# Patient Record
Sex: Female | Born: 1960
Health system: Southern US, Community
[De-identification: ages and names within clinical notes are randomized; demographics above are authoritative.]

## PROBLEM LIST (undated history)

## (undated) DIAGNOSIS — K649 Unspecified hemorrhoids: Secondary | ICD-10-CM

## (undated) DIAGNOSIS — K5790 Diverticulosis of intestine, part unspecified, without perforation or abscess without bleeding: Secondary | ICD-10-CM

## (undated) DIAGNOSIS — L409 Psoriasis, unspecified: Secondary | ICD-10-CM

## (undated) DIAGNOSIS — F329 Major depressive disorder, single episode, unspecified: Secondary | ICD-10-CM

## (undated) DIAGNOSIS — F419 Anxiety disorder, unspecified: Secondary | ICD-10-CM

## (undated) DIAGNOSIS — T7840XA Allergy, unspecified, initial encounter: Secondary | ICD-10-CM

## (undated) DIAGNOSIS — N870 Mild cervical dysplasia: Secondary | ICD-10-CM

## (undated) DIAGNOSIS — M810 Age-related osteoporosis without current pathological fracture: Secondary | ICD-10-CM

## (undated) DIAGNOSIS — Z9889 Other specified postprocedural states: Secondary | ICD-10-CM

## (undated) DIAGNOSIS — K635 Polyp of colon: Secondary | ICD-10-CM

## (undated) DIAGNOSIS — F32A Depression, unspecified: Secondary | ICD-10-CM

## (undated) DIAGNOSIS — R0981 Nasal congestion: Secondary | ICD-10-CM

## (undated) DIAGNOSIS — M1990 Unspecified osteoarthritis, unspecified site: Secondary | ICD-10-CM

## (undated) DIAGNOSIS — D649 Anemia, unspecified: Secondary | ICD-10-CM

## (undated) DIAGNOSIS — J302 Other seasonal allergic rhinitis: Secondary | ICD-10-CM

## (undated) DIAGNOSIS — I700 Atherosclerosis of aorta: Secondary | ICD-10-CM

## (undated) HISTORY — DX: Major depressive disorder, single episode, unspecified: F32.9

## (undated) HISTORY — PX: POLYPECTOMY: SHX149

## (undated) HISTORY — PX: TONSILLECTOMY: SHX5217

## (undated) HISTORY — PX: PELVIC LAPAROSCOPY: SHX162

## (undated) HISTORY — PX: LIPOMA EXCISION: SHX5283

## (undated) HISTORY — DX: Depression, unspecified: F32.A

## (undated) HISTORY — DX: Psoriasis, unspecified: L40.9

## (undated) HISTORY — DX: Mild cervical dysplasia: N87.0

## (undated) HISTORY — DX: Age-related osteoporosis without current pathological fracture: M81.0

## (undated) HISTORY — PX: GYNECOLOGIC CRYOSURGERY: SHX857

## (undated) HISTORY — DX: Nasal congestion: R09.81

## (undated) HISTORY — PX: COLPOSCOPY: SHX161

## (undated) HISTORY — PX: COLON SURGERY: SHX602

## (undated) HISTORY — PX: COLONOSCOPY: SHX174

## (undated) HISTORY — DX: Unspecified hemorrhoids: K64.9

## (undated) HISTORY — DX: Polyp of colon: K63.5

## (undated) HISTORY — DX: Allergy, unspecified, initial encounter: T78.40XA

## (undated) HISTORY — DX: Atherosclerosis of aorta: I70.0

## (undated) HISTORY — DX: Unspecified osteoarthritis, unspecified site: M19.90

---

## 1980-01-07 HISTORY — PX: APPENDECTOMY: SHX54

## 1997-08-10 ENCOUNTER — Ambulatory Visit (HOSPITAL_COMMUNITY): Admission: RE | Admit: 1997-08-10 | Discharge: 1997-08-10 | Payer: Self-pay | Admitting: Obstetrics and Gynecology

## 1998-01-06 HISTORY — PX: ABDOMINAL HYSTERECTOMY: SHX81

## 1998-01-06 HISTORY — PX: OOPHORECTOMY: SHX86

## 1998-07-13 ENCOUNTER — Encounter: Payer: Self-pay | Admitting: Obstetrics and Gynecology

## 1998-07-16 ENCOUNTER — Inpatient Hospital Stay (HOSPITAL_COMMUNITY): Admission: RE | Admit: 1998-07-16 | Discharge: 1998-07-19 | Payer: Self-pay | Admitting: Obstetrics and Gynecology

## 1998-07-16 ENCOUNTER — Encounter (INDEPENDENT_AMBULATORY_CARE_PROVIDER_SITE_OTHER): Payer: Self-pay | Admitting: Specialist

## 1998-09-24 ENCOUNTER — Encounter: Payer: Self-pay | Admitting: Obstetrics and Gynecology

## 1998-09-24 ENCOUNTER — Ambulatory Visit (HOSPITAL_COMMUNITY): Admission: RE | Admit: 1998-09-24 | Discharge: 1998-09-24 | Payer: Self-pay | Admitting: Obstetrics and Gynecology

## 1999-11-06 ENCOUNTER — Encounter: Payer: Self-pay | Admitting: Pulmonary Disease

## 1999-11-06 ENCOUNTER — Ambulatory Visit (HOSPITAL_COMMUNITY): Admission: RE | Admit: 1999-11-06 | Discharge: 1999-11-06 | Payer: Self-pay | Admitting: Pulmonary Disease

## 2000-01-07 HISTORY — PX: OOPHORECTOMY: SHX86

## 2000-07-20 ENCOUNTER — Encounter (INDEPENDENT_AMBULATORY_CARE_PROVIDER_SITE_OTHER): Payer: Self-pay

## 2000-07-21 ENCOUNTER — Inpatient Hospital Stay (HOSPITAL_COMMUNITY): Admission: EM | Admit: 2000-07-21 | Discharge: 2000-07-24 | Payer: Self-pay | Admitting: Obstetrics and Gynecology

## 2001-05-10 ENCOUNTER — Encounter: Payer: Self-pay | Admitting: Obstetrics and Gynecology

## 2001-05-10 ENCOUNTER — Ambulatory Visit (HOSPITAL_COMMUNITY): Admission: RE | Admit: 2001-05-10 | Discharge: 2001-05-10 | Payer: Self-pay | Admitting: Obstetrics and Gynecology

## 2001-06-09 ENCOUNTER — Other Ambulatory Visit: Admission: RE | Admit: 2001-06-09 | Discharge: 2001-06-09 | Payer: Self-pay | Admitting: Obstetrics and Gynecology

## 2002-04-20 ENCOUNTER — Encounter: Payer: Self-pay | Admitting: Family Medicine

## 2002-04-20 ENCOUNTER — Encounter: Admission: RE | Admit: 2002-04-20 | Discharge: 2002-04-20 | Payer: Self-pay | Admitting: Family Medicine

## 2002-11-15 ENCOUNTER — Ambulatory Visit (HOSPITAL_COMMUNITY): Admission: RE | Admit: 2002-11-15 | Discharge: 2002-11-15 | Payer: Self-pay | Admitting: Obstetrics and Gynecology

## 2002-11-24 ENCOUNTER — Other Ambulatory Visit: Admission: RE | Admit: 2002-11-24 | Discharge: 2002-11-24 | Payer: Self-pay | Admitting: Obstetrics and Gynecology

## 2003-11-23 ENCOUNTER — Ambulatory Visit (HOSPITAL_COMMUNITY): Admission: RE | Admit: 2003-11-23 | Discharge: 2003-11-23 | Payer: Self-pay | Admitting: Obstetrics and Gynecology

## 2003-12-05 ENCOUNTER — Other Ambulatory Visit: Admission: RE | Admit: 2003-12-05 | Discharge: 2003-12-05 | Payer: Self-pay | Admitting: Obstetrics and Gynecology

## 2004-12-04 ENCOUNTER — Other Ambulatory Visit: Admission: RE | Admit: 2004-12-04 | Discharge: 2004-12-04 | Payer: Self-pay | Admitting: Obstetrics and Gynecology

## 2005-01-09 ENCOUNTER — Ambulatory Visit (HOSPITAL_COMMUNITY): Admission: RE | Admit: 2005-01-09 | Discharge: 2005-01-09 | Payer: Self-pay | Admitting: Obstetrics and Gynecology

## 2005-09-26 ENCOUNTER — Emergency Department (HOSPITAL_COMMUNITY): Admission: EM | Admit: 2005-09-26 | Discharge: 2005-09-26 | Payer: Self-pay | Admitting: Emergency Medicine

## 2006-02-17 ENCOUNTER — Other Ambulatory Visit: Admission: RE | Admit: 2006-02-17 | Discharge: 2006-02-17 | Payer: Self-pay | Admitting: Obstetrics and Gynecology

## 2006-02-24 ENCOUNTER — Ambulatory Visit (HOSPITAL_COMMUNITY): Admission: RE | Admit: 2006-02-24 | Discharge: 2006-02-24 | Payer: Self-pay | Admitting: Obstetrics and Gynecology

## 2007-01-07 LAB — HM COLONOSCOPY

## 2007-12-21 ENCOUNTER — Ambulatory Visit: Payer: Self-pay | Admitting: Obstetrics and Gynecology

## 2008-05-10 ENCOUNTER — Ambulatory Visit (HOSPITAL_COMMUNITY): Admission: RE | Admit: 2008-05-10 | Discharge: 2008-05-10 | Payer: Self-pay | Admitting: Obstetrics and Gynecology

## 2008-05-17 ENCOUNTER — Ambulatory Visit: Payer: Self-pay | Admitting: Obstetrics and Gynecology

## 2008-05-17 ENCOUNTER — Encounter: Payer: Self-pay | Admitting: Obstetrics and Gynecology

## 2008-05-17 ENCOUNTER — Other Ambulatory Visit: Admission: RE | Admit: 2008-05-17 | Discharge: 2008-05-17 | Payer: Self-pay | Admitting: Obstetrics and Gynecology

## 2008-05-30 ENCOUNTER — Encounter (INDEPENDENT_AMBULATORY_CARE_PROVIDER_SITE_OTHER): Payer: Self-pay | Admitting: *Deleted

## 2009-01-19 ENCOUNTER — Telehealth: Payer: Self-pay | Admitting: Gastroenterology

## 2010-01-27 ENCOUNTER — Encounter: Payer: Self-pay | Admitting: Specialist

## 2010-02-05 NOTE — Progress Notes (Signed)
Summary: Changed practices.  Phone Note Outgoing Call   Call placed by: Harlow Mares CMA Duncan Dull),  January 19, 2009 4:23 PM Call placed to: Patient Summary of Call: patient states that she changed GI MDs and has already had her colonoscopy done. I had Lady Gary put a note in IDX patient transfered care.  Initial call taken by: Harlow Mares CMA Duncan Dull),  January 19, 2009 4:23 PM

## 2010-02-05 NOTE — Letter (Signed)
Summary: Recall Colonoscopy Letter  [facility]  [address] [place], [place] [address]   Phone: [phone]  Fax: [phone]      May 30, 2008 MRN: [id number]   [facility] [address] [place], [place]  [address]   Dear Ms. Sellen,   According to your medical record, it is time for you to schedule a Colonoscopy. The American Cancer Society recommends this procedure as a method to detect early colon cancer. Patients with a family history of colon cancer, or a personal history of colon polyps or inflammatory bowel disease are at increased risk.  This letter has beeen generated based on the recommendations made at the time of your procedure. If you feel that in your particular situation this may no longer apply, please contact our office.  Please call our office at [phone] to schedule this appointment or to update your records at your earliest convenience.  Thank you for cooperating with [place] to provide you with the very best care possible.   Sincerely,  Judie Petit T. Russella Dar, M.D.  [facility] [phone]

## 2010-05-24 NOTE — Discharge Summary (Signed)
[facility]  Patient:    Susan Aguirre, Susan Aguirre              MRN: [id number] Adm. Date:  [id number] Disc. Date: [id number] Attending:  Sharon Mt CC:         Thomas B. Samuella Cota, M.D.   Discharge Summary  HISTORY OF PRESENT ILLNESS:  The patient is a 50 year old female with chronic pelvic pain who was admitted to the hospital for probable right SNO with presumptive diagnosis of recurrent endometriosis.  HOSPITAL COURSE:  On the day of admission, she was taken to the operating room A diagnostic laparoscopy was performed.  Findings were dense small and large bowel adhesions in the pelvis.  She therefore underwent an exploratory laparotomy with lysis of all adhesions.  The findings when the ovary and tube were finally uncovered were endometriosis and she underwent a right salpingo-oophorectomy.  In freeing up the intestine, although there was no bowel injury, blood supply to the mesentery interfered with a very small area. After bleeding had been controlled, it was felt that the bowel looked dusky. Thomas B. Samuella Cota, M.D., was called in, who looked and agreed.  He felt that this portion of small bowel was resected and it was resected by him. Postoperatively, she had a small slight ileus.  By the fourth postoperative day, however, she was passing gas and having bowel movements.  DISCHARGE MEDICATIONS:  She was discharged on Darvocet-N for pain relief.  DIET:  Soft diet to advance as tolerated.  FOLLOW-UP:  She was to return to my office in one week for further follow-up.  FINAL PATHOLOGY REPORT:  Right ovary and tube with fibrous adhesions, decidual stromal change consistent with endometriosis, and small bowel with serosal fibrous  adhesions.  DISCHARGE DIAGNOSES: 1. Chronic right lower quadrant pain. 2. Endometriosis. 3. Ischemic small bowel.  OPERATIONS:  Diagnostic laparoscopy followed by exploratory laparotomy with lysis of small bowel and  large bowel adhesions, right salpingo-oophorectomy, and partial small bowel resection.  CONDITION ON DISCHARGE:  Improved. DD:  08/06/00 TD:  08/07/00 Job: [id number]

## 2010-05-24 NOTE — Op Note (Signed)
[facility]  Patient:    Susan Aguirre, Susan Aguirre              MRN: [id number] Proc. Date: 07/20/00 Adm. Date:  [id number] Attending:  Sharon Mt                           Operative Report  PREOPERATIVE DIAGNOSIS:  Pelvic pain with endometriosis suspected.  POSTOPERATIVE DIAGNOSIS:  Pelvic pain, pelvic adhesive disease, endometriosis, probable.  OPERATION:  Diagnostic laparoscopy followed by exploratory laparotomy with lysis of pelvic adhesions, right salpingo-oophorectomy, and resection of small segment of small-bowel.  SURGEON:  Daniel L. Eda Paschal, M.D. and Donnie Coffin. Samuella Cota, M.D.  FIRST ASSISTANT:  Douglass Rivers, M.D.  FINDINGS:  At the time of both laparoscopy and laparotomy, the patient had dense adhesions of small-bowel and large bowel into the patients pelvis, into the vaginal cuff and into the ovary and tube on right.  You could see why when the patient had ovulatory function there would be pain because of the dense adherence of bowel to the ovary.  In addition there was some stain of the ovary consistent with endometriosis.  PROCEDURE:  After adequate general endotracheal anesthesia, the patient was placed in the dorsolithotomy position and prepped and draped in the usual sterile manner.  A Foley catheter was inserted into the patients bladder.  A pneumoperitoneum was created with Voorhees needle, a 10-mm trocar was placed subumbilical, and through that the operating laparoscope was placed, attached to the camera.  A 5-mm port was placed suprapubically.  Through the laparoscope you could see how densely adherent bowel was, both to the top of the vaginal cuff and also to the right lower quadrant.  In spite of significant manipulations, the ovary and tube could not be seen on the right. It was therefore elected to proceed with laparotomy.  The laparoscope was removed and the subumbilical incision was closed with 0 Vicryl through  the fascia.  A _____ incision was made, th fascia was opened transversely, the peritoneum was entered vertically, subcutaneous bleeders were clamped, and Bovie disencountered.  When the peritoneal cavity was opened, there was dense pelvic adhesive disease involving small and large bowel to the top of the cuff and to the ovary and tube on the right.  It took a significant amount of time to dissect all of these adhesions free.  At this point the retroperitoneal space could be entered, the ureter could be identified, and the infundibular pelvic ligament was clamp, cut, and double suture ligated with #1 chronic catgut. The rest of the attachments of the adnexa to both small and large bowel and peritoneum was freed up without injuring the ureter. There was a small little segment of ovary that was densely adherent to the cecum and this was removed without difficulty.  At this point it was seen that a portion of the small-bowel that had been freed up appeared to be dusky.  There had been some interference of blood flow to the mesentery at this part of the small-bowel, and it was felt that there was some concern how viable this bowel would be long-term.  Dr. Samuella Cota was called in, and he agreed that it did look dusky and felt that she should have that small portion of small-bowel resected, which he did, and it is dictated under his operative note.  At the termination of the procedure, copious irrigation was done with Ringer lactate.  The sponge, needle, and instrument  counts were correct.  The fascia was closed with 2 running 0 Vicryl and the skin was closed with staples. Estimated blood loss for the entire procedure was 200 cc with none replaced. The patient tolerated the procedure well and left the operating room in satisfactory condition draining clear urine from her Foley catheter. DD:  07/20/00 TD:  07/20/00 Job: [id number]

## 2010-05-24 NOTE — H&P (Signed)
[facility]  Patient:    Susan Aguirre, Susan Aguirre                       MRN: [id number] Attending:  Rande Brunt. Eda Paschal, M.D.                         History and Physical  CHIEF COMPLAINT:  Right lower quadrant pain.  HISTORY OF PRESENT ILLNESS:  The patient is a 50 year old, gravida 1, para 0, abortus 1, who presented to the office in September of last year with a history of cyclical right lower quadrant pain.  It will last approximately two to three days.  It feels very similar to when she had the endometriosis prior to her previous surgery and it is making her incapacitated with it.  She had previously undergone a total abdominal hysterectomy and left salpingo-oophorectomy for endometriosis, done back in the year 2000.  She was initially treated with Depo-Provera for the above and found that it did suppress her discomfort.  She has done that for awhile, but now she has started to have side-effects from the Depo-Provera, including hair loss, depression, increasing facial hair, and headaches.  She had previously failed oral contraceptives and had not done well prior to her hysterectomy with Depo-Lupron.  As a result of the above and the fact that it is cyclical, it is suspected that she either has recurrent endometriosis or pelvic adhesive disease.  As a result of the above, she now enters the hospital for diagnostic laparoscopy with right salpingo-oophorectomy.  She does understand the issues of starting menopause at 31.  She also understands that her surgery may not be doable laparoscopically, in which case she has given me permission to proceed with an incision.  CURRENT MEDICATIONS:  Just medicine for pain.  ALLERGIES:  CODEINE.  PREVIOUS SURGERIES:  Appendectomy in 1980, total abdominal hysterectomy and left salpingo-oophorectomy in July 2000.  FAMILY HISTORY:  Grandmother and mother are diabetic; grandmother, mother, and uncle have hypertension;  grandmother, mother, and uncle have cardiovascular disease; father and aunt have had lymphoma.  REVIEW OF SYSTEMS:  HEENT: Negative, except for headaches and neck stiffness. CARDIOVASCULAR: Negative.  RESPIRATORY: Negative.  GI: Reveals a history of hemorrhoids and right lower quadrant pain.  NEUROMUSCULAR: Negative. GU: Negative.  ALLERGIC/IMMUNE/LYMPHATIC/ENDOCRINE: Negative.  PHYSICAL EXAMINATION:  GENERAL:  The patient is a well-developed and well-nourished female in no acute distress.  VITAL SIGNS:  Blood pressure 130/70, pulse 80 and regular, respirations 16 and nonlabored.  She is afebrile.  HEENT:  All within normal limits.  NECK:  Supple, trachea midline.  Thyroid is not enlarged.  LUNGS:  Clear to P&A.  HEART:  No thrills, heaves, or murmurs.  BREASTS:  No masses.  ABDOMEN:  Soft, without guarding, rebound, or masses.  PELVIC:  External and vaginal within normal limits.  Pap smear shows no atypia.  Bimanual and rectal fail to reveal any masses nor were there any masses on ultrasound, however, her right adnexa is exquisitely tender.  ADMISSION IMPRESSION:  Cyclical right lower quadrant pain with recurrent endometriosis suspected.  PLAN:  DL with right S&O, possible laparotomy. DD:  07/19/00 TD:  07/19/00 Job: [id number]

## 2010-05-24 NOTE — Op Note (Signed)
[facility]  Patient:    Susan Aguirre, Susan Aguirre              MRN: [id number] Adm. Date:  [id number] Attending:  Sharon Mt                           Operative Report  PREOPERATIVE DIAGNOSIS:  Ischemic small-bowel.  POSTOPERATIVE DIAGNOSIS:  Ischemic small-bowel.  OPERATION:  Partial small-bowel resection.  SURGEON:  Maisie Fus B. Samuella Cota, M.D.  ASSISTANT:  Rande Brunt. Eda Paschal, M.D.  PROCEDURE:  Dr. Edyth Gunnels was doing an open laparotomy, and in the process of taking down some small-bowel that was adherent to the right tube and ovaries, the mesentery of the small-bowel developed bleeding, and after the bleeders were ligated there seemed to be a small segment of distal small-bowel which was somewhat ischemic.  I was asked to see the patient for evaluation.  The evaluation was that the bowel did look ischemic, and it was felt safer to remove a short segment of this small-bowel with an anastomosis. The small-bowel was divided between Kocher clamps proximal and distal to this ischemic area.  The crushed ends of the small-bowel were then excised and an end-to-end anastomosis was carried out using 3-0 black silk.  The sutures were placed through-and-through with the knots on the inside except for the last few sutures anteriorly which were placed as a Gambee sutures.  This gave good closure of the defect with minimal exposed mucosa.  The anastomosis was airtight to compression on each side.  The small-bowel mesentery was closed with several interrupted sutures of 3-0 black silk.  Dr. Eda Paschal will dictate his portion of the operation and dictate the closure. DD:  07/20/00 TD:  07/20/00 Job: [id number]

## 2010-10-04 ENCOUNTER — Other Ambulatory Visit: Payer: Self-pay | Admitting: Obstetrics and Gynecology

## 2010-10-04 DIAGNOSIS — Z1231 Encounter for screening mammogram for malignant neoplasm of breast: Secondary | ICD-10-CM

## 2010-10-22 ENCOUNTER — Ambulatory Visit (HOSPITAL_COMMUNITY)
Admission: RE | Admit: 2010-10-22 | Discharge: 2010-10-22 | Disposition: A | Discharge status: Home / Self Care | Payer: Managed Care, Other (non HMO) | Source: Ambulatory Visit | Attending: Obstetrics and Gynecology | Admitting: Obstetrics and Gynecology

## 2010-10-22 DIAGNOSIS — Z1231 Encounter for screening mammogram for malignant neoplasm of breast: Secondary | ICD-10-CM | POA: Insufficient documentation

## 2010-11-21 LAB — HM PAP SMEAR: HM Pap smear: NORMAL

## 2010-11-21 LAB — HM MAMMOGRAPHY: HM Mammogram: NEGATIVE

## 2010-11-26 DIAGNOSIS — N809 Endometriosis, unspecified: Secondary | ICD-10-CM | POA: Insufficient documentation

## 2010-12-04 ENCOUNTER — Encounter: Payer: Self-pay | Admitting: Obstetrics and Gynecology

## 2010-12-04 ENCOUNTER — Ambulatory Visit (INDEPENDENT_AMBULATORY_CARE_PROVIDER_SITE_OTHER): Payer: Managed Care, Other (non HMO) | Admitting: Obstetrics and Gynecology

## 2010-12-04 ENCOUNTER — Other Ambulatory Visit (HOSPITAL_COMMUNITY)
Admission: RE | Admit: 2010-12-04 | Discharge: 2010-12-04 | Disposition: A | Discharge status: Home / Self Care | Payer: Managed Care, Other (non HMO) | Source: Ambulatory Visit | Attending: Obstetrics and Gynecology | Admitting: Obstetrics and Gynecology

## 2010-12-04 VITALS — BP 120/76 | Ht 66.0 in | Wt 110.0 lb

## 2010-12-04 DIAGNOSIS — R823 Hemoglobinuria: Secondary | ICD-10-CM

## 2010-12-04 DIAGNOSIS — N952 Postmenopausal atrophic vaginitis: Secondary | ICD-10-CM

## 2010-12-04 DIAGNOSIS — N920 Excessive and frequent menstruation with regular cycle: Secondary | ICD-10-CM

## 2010-12-04 DIAGNOSIS — Z01419 Encounter for gynecological examination (general) (routine) without abnormal findings: Secondary | ICD-10-CM

## 2010-12-04 DIAGNOSIS — N870 Mild cervical dysplasia: Secondary | ICD-10-CM | POA: Insufficient documentation

## 2010-12-04 DIAGNOSIS — M810 Age-related osteoporosis without current pathological fracture: Secondary | ICD-10-CM

## 2010-12-04 DIAGNOSIS — K649 Unspecified hemorrhoids: Secondary | ICD-10-CM | POA: Insufficient documentation

## 2010-12-04 NOTE — Progress Notes (Signed)
Addended by: Landis Martins R on: 12/04/2010 05:24 PM   Modules accepted: Orders

## 2010-12-04 NOTE — Progress Notes (Signed)
Patient came to see me today for her annual GYN exam. She continues to have severe hot flashes but is scared to take HRT. She has vaginal dryness but says is responding well to over-the-counter medication. She is still mammograms. She is osteoporosis on bone density and we discussed this previously and she continues to refuse to take medication. She's had no vaginal bleeding. She is having no pelvic pain. She had a see a surgeon about her hemorrhoids and he was able to help her nonsurgically. She does her lab through PCP.  HEENT: Within normal limits.  Kennon Portela present Neck: No masses. Supraclavicular lymph nodes: Not enlarged. Breasts: Examined in both sitting and lying position. Symmetrical without skin changes or masses. Abdomen: Soft no masses guarding or rebound. No hernias. Pelvic: External within normal limits. BUS within normal limits. Vaginal examination shows good estrogen effect, no cystocele enterocele or rectocele. Cervix and uterus absent. Adnexa within normal limits. Rectovaginal confirmatory. Extremities within normal limits.  Assessment: #1. Menopausal symptoms #2. Osteoporosis  Plan: Explained to her that taking estrogen without progesterone does not increase her risk of breast cancer. Discussed estrogen patch to prevent a higher risk of DVT. She still declined. She still declined treatment for her osteoporosis. She will continue yearly mammograms. We discussed frequency of Pap smears. 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 It is now been over 20 years. I told her I thought we could do a Pap smear every 2-3 years.

## 2010-12-09 MED ORDER — FLUCONAZOLE 150 MG PO TABS: 150.0000 mg | ORAL_TABLET | Freq: Every day | ORAL | Status: DC

## 2010-12-09 NOTE — Progress Notes (Signed)
Addended by: Venora Maples on: 12/09/2010 04:16 PM   Modules accepted: Orders

## 2010-12-10 ENCOUNTER — Encounter: Payer: Self-pay | Admitting: Gynecology

## 2011-01-03 ENCOUNTER — Emergency Department (INDEPENDENT_AMBULATORY_CARE_PROVIDER_SITE_OTHER)
Admission: EM | Admit: 2011-01-03 | Discharge: 2011-01-03 | Disposition: A | Discharge status: Home / Self Care | Payer: Managed Care, Other (non HMO) | Source: Home / Self Care

## 2011-01-03 ENCOUNTER — Encounter (HOSPITAL_COMMUNITY): Payer: Self-pay

## 2011-01-03 DIAGNOSIS — J988 Other specified respiratory disorders: Secondary | ICD-10-CM

## 2011-01-03 DIAGNOSIS — B9789 Other viral agents as the cause of diseases classified elsewhere: Secondary | ICD-10-CM

## 2011-01-03 DIAGNOSIS — J9801 Acute bronchospasm: Secondary | ICD-10-CM

## 2011-01-03 HISTORY — DX: Anxiety disorder, unspecified: F41.9

## 2011-01-03 HISTORY — DX: Other seasonal allergic rhinitis: J30.2

## 2011-01-03 MED ORDER — ALBUTEROL SULFATE (5 MG/ML) 0.5% IN NEBU
INHALATION_SOLUTION | RESPIRATORY_TRACT | Status: AC
  Filled 2011-01-03: qty 0.5

## 2011-01-03 MED ORDER — IPRATROPIUM BROMIDE 0.02 % IN SOLN
0.5000 mg | Freq: Once | RESPIRATORY_TRACT | Status: AC
  Administered 2011-01-03: 0.5 mg via RESPIRATORY_TRACT

## 2011-01-03 MED ORDER — ALBUTEROL SULFATE (5 MG/ML) 0.5% IN NEBU
2.5000 mg | INHALATION_SOLUTION | Freq: Once | RESPIRATORY_TRACT | Status: AC
  Administered 2011-01-03: 2.5 mg via RESPIRATORY_TRACT

## 2011-01-03 MED ORDER — ALBUTEROL SULFATE HFA 108 (90 BASE) MCG/ACT IN AERS: 2.0000 | INHALATION_SPRAY | RESPIRATORY_TRACT | Status: DC | PRN

## 2011-01-03 MED ORDER — ALBUTEROL SULFATE (5 MG/ML) 0.5% IN NEBU: 5.0000 mg | INHALATION_SOLUTION | Freq: Once | RESPIRATORY_TRACT | Status: DC

## 2011-01-03 NOTE — ED Provider Notes (Signed)
History     CSN: [id number]  Arrival date & time 01/03/11  1416   None     Chief Complaint  Patient presents with  . Cough  . Generalized Body Aches    (Consider location/radiation/quality/duration/timing/severity/associated sxs/prior treatment) HPI Comments: Onset 4 days ago of cough. Pt describes it as a "tickley cough" in her throat. She has had some wheezing, but denies dyspnea. She also c/o body aches, weakness, chills, small amount of rhinorrhea, nausea, and sore throat. No fever. She has been taking Mucinex for her symptoms. No hx of asthma or COPD.  The history is provided by the patient.    Past Medical History  Diagnosis Date  . Endometriosis   . Psoriasis   . Calcification of aorta     Abdominal aorta  . CIN I (cervical intraepithelial neoplasia I)   . Hemorrhoid   . Dysplastic colon polyp   . Anxiety   . Seasonal allergies     Past Surgical History  Procedure Date  . Oophorectomy 2002    DX LAP W/RSO  . Oophorectomy 2000    TAH W LSO  . Abdominal hysterectomy 2000    TAH,LSO (RSO IN 2002)  . Appendectomy 1982    LYSIS OF BOWEL ADHESIONS  . Colposcopy   . Gynecologic cryosurgery   . Pelvic laparoscopy     DL    Family History  Problem Relation Age of Onset  . Diabetes Mother   . Hypertension Mother   . Lymphoma Father   . Diabetes Maternal Grandmother   . Hypertension Maternal Grandmother   . Heart disease Maternal Grandmother     History  Substance Use Topics  . Smoking status: Current Everyday Smoker -- 0.5 packs/day    Types: Cigarettes  . Smokeless tobacco: Not on file  . Alcohol Use: 1.0 oz/week    2 drink(s) per week    OB History    Grav Para Term Preterm Abortions TAB SAB Ect Mult Living   1    1     0      Review of Systems  Constitutional: Positive for chills. Negative for fever.  HENT: Positive for sore throat and rhinorrhea. Negative for ear pain, congestion and sinus pressure.   Respiratory: Positive for cough and  wheezing. Negative for shortness of breath.   Cardiovascular: Negative for chest pain.    Allergies  Codeine  Home Medications   Current Outpatient Rx  Name Route Sig Dispense Refill  . ALPRAZOLAM 0.5 MG PO TABS Oral Take 0.5 mg by mouth at bedtime as needed.      . ASPIRIN PO Oral Take 81 mg by mouth.     Marland Kitchen CALCIUM + D PO Oral Take by mouth.      . ALBUTEROL SULFATE HFA 108 (90 BASE) MCG/ACT IN AERS Inhalation Inhale 2 puffs into the lungs every 4 (four) hours as needed for wheezing. 1 Inhaler 0    BP 143/100  Pulse 79  Temp(Src) 98.6 F (37 C) (Oral)  Resp 20  SpO2 99%  Physical Exam  Nursing note and vitals reviewed. Constitutional: She appears well-developed and well-nourished. No distress.  HENT:  Head: Normocephalic and atraumatic.  Right Ear: Tympanic membrane, external ear and ear canal normal.  Left Ear: Tympanic membrane, external ear and ear canal normal.  Nose: Nose normal.  Mouth/Throat: Uvula is midline, oropharynx is clear and moist and mucous membranes are normal. No oropharyngeal exudate, posterior oropharyngeal edema or posterior oropharyngeal erythema.  Neck:  Neck supple.  Cardiovascular: Normal rate, regular rhythm and normal heart sounds.   Pulmonary/Chest: Effort normal. No respiratory distress. She has decreased breath sounds (bilat). She has wheezes (bilat). She has no rhonchi. She has no rales.  Lymphadenopathy:    She has no cervical adenopathy.  Neurological: She is alert.  Skin: Skin is warm and dry.  Psychiatric: She has a normal mood and affect.    ED Course  Procedures (including critical care time)  Labs Reviewed - No data to display No results found.   1. Viral respiratory infection   2. Bronchospasm       MDM  After NMT Lungs CTA.         Melody Comas, [place] 01/03/11 [phone]

## 2011-01-03 NOTE — ED Notes (Signed)
C/o dry cough, body aches, ear and head pressure, burning in throat and chest and nausea for 5 days.

## 2011-01-07 NOTE — ED Provider Notes (Signed)
Medical screening examination/treatment/procedure(s) were performed by non-physician practitioner and as supervising physician I was immediately available for consultation/collaboration.   Bolton Canupp DOUGLAS MD.    Callen Vancuren Douglas Mackey Varricchio, MD 01/07/11 1400 

## 2011-02-21 ENCOUNTER — Ambulatory Visit (INDEPENDENT_AMBULATORY_CARE_PROVIDER_SITE_OTHER): Payer: Managed Care, Other (non HMO) | Admitting: Internal Medicine

## 2011-02-21 ENCOUNTER — Encounter: Payer: Self-pay | Admitting: Internal Medicine

## 2011-02-21 DIAGNOSIS — Z0000 Encounter for general adult medical examination without abnormal findings: Secondary | ICD-10-CM

## 2011-02-21 DIAGNOSIS — K635 Polyp of colon: Secondary | ICD-10-CM | POA: Insufficient documentation

## 2011-02-21 DIAGNOSIS — D126 Benign neoplasm of colon, unspecified: Secondary | ICD-10-CM

## 2011-02-21 LAB — COMPREHENSIVE METABOLIC PANEL
ALT: 11 U/L (ref 0–35)
AST: 23 U/L (ref 0–37)
Albumin: 4.2 g/dL (ref 3.5–5.2)
Alkaline Phosphatase: 68 U/L (ref 39–117)
BUN: 12 mg/dL (ref 6–23)
CO2: 28 mEq/L (ref 19–32)
Calcium: 9.5 mg/dL (ref 8.4–10.5)
Chloride: 106 mEq/L (ref 96–112)
Creatinine, Ser: 0.6 mg/dL (ref 0.4–1.2)
GFR: 103.3 mL/min (ref >60.00)
Glucose, Bld: 83 mg/dL (ref 70–99)
Potassium: 5.1 mEq/L (ref 3.5–5.1)
Sodium: 140 mEq/L (ref 135–145)
Total Bilirubin: 0.7 mg/dL (ref 0.3–1.2)
Total Protein: 6.9 g/dL (ref 6.0–8.3)

## 2011-02-21 LAB — LIPID PANEL
Cholesterol: 183 mg/dL (ref 0–200)
HDL: 65.1 mg/dL (ref >39.00)
LDL Cholesterol: 94 mg/dL (ref 0–99)
Total CHOL/HDL Ratio: 3
Triglycerides: 120 mg/dL (ref 0.0–149.0)
VLDL: 24 mg/dL (ref 0.0–40.0)

## 2011-02-21 LAB — CBC WITH DIFFERENTIAL/PLATELET
Basophils Absolute: 0 10*3/uL (ref 0.0–0.1)
Basophils Relative: 0.1 % (ref 0.0–3.0)
Eosinophils Absolute: 0.1 10*3/uL (ref 0.0–0.7)
Eosinophils Relative: 1.3 % (ref 0.0–5.0)
HCT: 39.2 % (ref 36.0–46.0)
Hemoglobin: 13.1 g/dL (ref 12.0–15.0)
Lymphocytes Relative: 36.2 % (ref 12.0–46.0)
Lymphs Abs: 3.9 10*3/uL (ref 0.7–4.0)
MCHC: 33.5 g/dL (ref 30.0–36.0)
MCV: 92.3 fl (ref 78.0–100.0)
Monocytes Absolute: 0.6 10*3/uL (ref 0.1–1.0)
Monocytes Relative: 6 % (ref 3.0–12.0)
Neutro Abs: 6.1 10*3/uL (ref 1.4–7.7)
Neutrophils Relative %: 56.4 % (ref 43.0–77.0)
Platelets: 244 10*3/uL (ref 150.0–400.0)
RBC: 4.25 Mil/uL (ref 3.87–5.11)
RDW: 13.6 % (ref 11.5–14.6)
WBC: 10.8 10*3/uL — ABNORMAL HIGH (ref 4.5–10.5)

## 2011-02-21 LAB — TSH: TSH: 1.21 u[IU]/mL (ref 0.35–5.50)

## 2011-02-21 MED ORDER — ALPRAZOLAM 0.5 MG PO TABS: 0.5000 mg | ORAL_TABLET | Freq: Three times a day (TID) | ORAL | Status: DC | PRN

## 2011-02-21 MED ORDER — ALBUTEROL SULFATE HFA 108 (90 BASE) MCG/ACT IN AERS: 2.0000 | INHALATION_SPRAY | RESPIRATORY_TRACT | Status: DC | PRN

## 2011-02-21 NOTE — Progress Notes (Signed)
Quick Note:  Spoke with pt - informed of labs ______ 

## 2011-02-21 NOTE — Patient Instructions (Signed)
Smoking tobacco is very bad for your health. You should stop smoking immediately.  Take a calcium supplement, plus [phone] units of vitamin D    It is important that you exercise regularly, at least 20 minutes 3 to 4 times per week.  If you develop chest pain or shortness of breath seek  medical attention.  Return in one year for follow-up

## 2011-02-21 NOTE — Progress Notes (Signed)
Subjective:    Patient ID: Susan Aguirre, female    DOB: May 10, 1960, 51 y.o.   MRN: [id number]  HPI  51 y/o is seen today to establish  with our practice.  Medical problems include a history of allergic rhinitis mild asthma. She has seen allergy medicine in the past. She has a history of colonic polyps and is scheduled for followup colonoscopy in one year. She has remote history of endometriosis and is status post gynecologic surgery in 2000 and 2002. She has had a complete hysterectomy and oophorectomy. She has a history of mild anxiety and depression and does use alprazolam 2 or 3 times daily. She is followed annually by gynecology.  Past Medical History  Diagnosis Date  . Endometriosis   . Psoriasis   . Calcification of aorta     Abdominal aorta  . CIN I (cervical intraepithelial neoplasia I)   . Hemorrhoid   . Dysplastic colon polyp   . Anxiety   . Seasonal allergies   . Depression   . Asthma   . Arthritis     History   Social History  . Marital Status: Married    Spouse Name: N/A    Number of Children: N/A  . Years of Education: N/A   Occupational History  . Not on file.   Social History Main Topics  . Smoking status: Current Everyday Smoker -- 0.5 packs/day    Types: Cigarettes  . Smokeless tobacco: Never Used  . Alcohol Use: 1.0 oz/week    2 drink(s) per week  . Drug Use: No  . Sexually Active: Yes    Birth Control/ Protection: Surgical   Other Topics Concern  . Not on file   Social History Narrative  . No narrative on file    Past Surgical History  Procedure Date  . Oophorectomy 2002    DX LAP W/RSO  . Oophorectomy 2000    TAH W LSO  . Abdominal hysterectomy 2000    TAH,LSO (RSO IN 2002)  . Appendectomy 1982    LYSIS OF BOWEL ADHESIONS  . Colposcopy   . Gynecologic cryosurgery   . Pelvic laparoscopy     DL  . Tonsillectomy     Family History  Problem Relation Age of Onset  . Diabetes Mother   . Hypertension Mother   . Lymphoma  Father   . Cancer Father   . Diabetes Maternal Grandmother   . Hypertension Maternal Grandmother   . Heart disease Maternal Grandmother     Allergies  Allergen Reactions  . Codeine     Current Outpatient Prescriptions on File Prior to Visit  Medication Sig Dispense Refill  . albuterol (PROVENTIL HFA;VENTOLIN HFA) 108 (90 BASE) MCG/ACT inhaler Inhale 2 puffs into the lungs every 4 (four) hours as needed for wheezing.  1 Inhaler  0  . ALPRAZolam (XANAX) 0.5 MG tablet Take 0.5 mg by mouth 3 (three) times daily as needed.       . ASPIRIN PO Take 81 mg by mouth.       . Calcium Carbonate-Vitamin D (CALCIUM + D PO) Take by mouth.          BP 120/84  Pulse 72  Temp(Src) 98.1 F (36.7 C) (Oral)  Resp 18  Ht 5' 6.75" (1.695 m)  Wt 112 lb (50.803 kg)  BMI 17.67 kg/m2  SpO2 98%       Review of Systems  Constitutional: Negative for fever, appetite change, fatigue and unexpected weight change.  HENT:  Negative for hearing loss, ear pain, nosebleeds, congestion, sore throat, mouth sores, trouble swallowing, neck stiffness, dental problem, voice change, sinus pressure and tinnitus.   Eyes: Negative for photophobia, pain, redness and visual disturbance.  Respiratory: Negative for cough, chest tightness and shortness of breath.   Cardiovascular: Negative for chest pain, palpitations and leg swelling.  Gastrointestinal: Negative for nausea, vomiting, abdominal pain, diarrhea, constipation, blood in stool, abdominal distention and rectal pain.  Genitourinary: Negative for dysuria, urgency, frequency, hematuria, flank pain, vaginal bleeding, vaginal discharge, difficulty urinating, genital sores, vaginal pain, menstrual problem and pelvic pain.  Musculoskeletal: Negative for back pain and arthralgias.  Skin: Negative for rash.  Neurological: Negative for dizziness, syncope, speech difficulty, weakness, light-headedness, numbness and headaches.  Hematological: Negative for adenopathy. Does  not bruise/bleed easily.  Psychiatric/Behavioral: Negative for suicidal ideas, behavioral problems, self-injury, dysphoric mood and agitation. The patient is not nervous/anxious.        Objective:   Physical Exam  Constitutional: She is oriented to person, place, and time. She appears well-developed and well-nourished.  HENT:  Head: Normocephalic and atraumatic.  Right Ear: External ear normal.  Left Ear: External ear normal.  Mouth/Throat: Oropharynx is clear and moist.  Eyes: Conjunctivae and EOM are normal.  Neck: Normal range of motion. Neck supple. No JVD present. No thyromegaly present.  Cardiovascular: Normal rate, regular rhythm, normal heart sounds and intact distal pulses.   No murmur heard. Pulmonary/Chest: Effort normal and breath sounds normal. She has no wheezes. She has no rales.  Abdominal: Soft. Bowel sounds are normal. She exhibits no distension and no mass. There is no tenderness. There is no rebound and no guarding.  Genitourinary: Vagina normal.  Musculoskeletal: Normal range of motion. She exhibits no edema and no tenderness.  Neurological: She is alert and oriented to person, place, and time. She has normal reflexes. No cranial nerve deficit. She exhibits normal muscle tone. Coordination normal.  Skin: Skin is warm and dry. No rash noted.  Psychiatric: She has a normal mood and affect. Her behavior is normal.          Assessment & Plan:  Preventive health examination  Ongoing tobacco use History of allergic rhinitis and asthmatic bronchitis History of colonic polyps  Recheck one year Laboratory data reviewed Total cessation of smoking encouraged

## 2011-04-03 ENCOUNTER — Telehealth: Payer: Self-pay | Admitting: Family Medicine

## 2011-04-03 NOTE — Telephone Encounter (Signed)
Pt requesting you re-fax lab results.  Pt also has questions about her husband b12 shot. Please contact

## 2011-04-03 NOTE — Telephone Encounter (Signed)
Spoke with pt- discussed labs - and B12 injection for husband

## 2011-04-18 ENCOUNTER — Telehealth: Payer: Self-pay | Admitting: *Deleted

## 2011-04-18 NOTE — Telephone Encounter (Signed)
Pt would like to have Susan Aguirre get all the copies of her labs that were sent to [facility] so she can pick them up.  [facility] keeps saying they do not have the copies Kim sent.

## 2011-04-21 NOTE — Telephone Encounter (Signed)
ptp aware ready for pick up

## 2011-04-30 ENCOUNTER — Telehealth: Payer: Self-pay | Admitting: Internal Medicine

## 2011-04-30 NOTE — Telephone Encounter (Signed)
Pt called and said that the previous fax # on Wellness Form is wrong. The correct fax # is [phone].

## 2011-05-02 NOTE — Telephone Encounter (Signed)
Sent ppwk to new fax # given - confirmation recv'd.

## 2011-07-11 ENCOUNTER — Telehealth: Payer: Self-pay | Admitting: Gastroenterology

## 2011-07-11 NOTE — Telephone Encounter (Signed)
Dr. Stark do you approve of transfer? 

## 2011-07-13 NOTE — Telephone Encounter (Signed)
OK with me.

## 2011-07-14 NOTE — Telephone Encounter (Signed)
Dr. Marina Goodell will you accept this patient? Patient would like to switch to you because you see her husband.  Please advise

## 2011-07-14 NOTE — Telephone Encounter (Signed)
Sure. Thanks 

## 2011-07-15 ENCOUNTER — Encounter: Payer: Self-pay | Admitting: Internal Medicine

## 2011-07-15 NOTE — Telephone Encounter (Signed)
Called pt and sch'd a direct colon w/Dr. Marina Goodell for 08-27-11 and previsit on 08-14-11 @ 4:30. Mailed pt paperwork and notified of cancelation policy

## 2011-08-14 ENCOUNTER — Ambulatory Visit (AMBULATORY_SURGERY_CENTER): Payer: Managed Care, Other (non HMO)

## 2011-08-14 VITALS — Ht 67.0 in | Wt 107.7 lb

## 2011-08-14 DIAGNOSIS — K589 Irritable bowel syndrome without diarrhea: Secondary | ICD-10-CM

## 2011-08-14 DIAGNOSIS — Z1211 Encounter for screening for malignant neoplasm of colon: Secondary | ICD-10-CM

## 2011-08-14 DIAGNOSIS — K649 Unspecified hemorrhoids: Secondary | ICD-10-CM

## 2011-08-14 MED ORDER — MOVIPREP 100 G PO SOLR: ORAL | Status: DC

## 2011-08-15 ENCOUNTER — Encounter: Payer: Self-pay | Admitting: Internal Medicine

## 2011-08-19 ENCOUNTER — Other Ambulatory Visit: Payer: Self-pay | Admitting: Internal Medicine

## 2011-08-27 ENCOUNTER — Ambulatory Visit (AMBULATORY_SURGERY_CENTER): Payer: Managed Care, Other (non HMO) | Admitting: Internal Medicine

## 2011-08-27 ENCOUNTER — Encounter: Payer: Self-pay | Admitting: Internal Medicine

## 2011-08-27 VITALS — BP 120/98 | HR 70 | Temp 96.1°F | Resp 20 | Ht 67.0 in | Wt 107.0 lb

## 2011-08-27 DIAGNOSIS — Z1211 Encounter for screening for malignant neoplasm of colon: Secondary | ICD-10-CM

## 2011-08-27 DIAGNOSIS — K589 Irritable bowel syndrome without diarrhea: Secondary | ICD-10-CM

## 2011-08-27 DIAGNOSIS — K649 Unspecified hemorrhoids: Secondary | ICD-10-CM

## 2011-08-27 DIAGNOSIS — D126 Benign neoplasm of colon, unspecified: Secondary | ICD-10-CM

## 2011-08-27 DIAGNOSIS — Z86010 Personal history of colonic polyps: Secondary | ICD-10-CM

## 2011-08-27 MED ORDER — SODIUM CHLORIDE 0.9 % IV SOLN: 500.0000 mL | INTRAVENOUS | Status: DC

## 2011-08-27 NOTE — Progress Notes (Signed)
Patient did not experience any of the following events: a burn prior to discharge; a fall within the facility; wrong site/side/patient/procedure/implant event; or a hospital transfer or hospital admission upon discharge from the facility. (G8907) Patient did not have preoperative order for IV antibiotic SSI prophylaxis. (G8918)  

## 2011-08-27 NOTE — Patient Instructions (Addendum)
1 small polyp removed and sent to pathology.  Moderate diverticulosis.  Follow up colonoscopy in 5 years.  YOU HAD AN ENDOSCOPIC PROCEDURE TODAY AT THE [facility]: Refer to the procedure report that was given to you for any specific questions about what was found during the examination.  If the procedure report does not answer your questions, please call your gastroenterologist to clarify.  If you requested that your care partner not be given the details of your procedure findings, then the procedure report has been included in a sealed envelope for you to review at your convenience later.  YOU SHOULD EXPECT: Some feelings of bloating in the abdomen. Passage of more gas than usual.  Walking can help get rid of the air that was put into your GI tract during the procedure and reduce the bloating. If you had a lower endoscopy (such as a colonoscopy or flexible sigmoidoscopy) you may notice spotting of blood in your stool or on the toilet paper. If you underwent a bowel prep for your procedure, then you may not have a normal bowel movement for a few days.  DIET: Your first meal following the procedure should be a light meal and then it is ok to progress to your normal diet.  A half-sandwich or bowl of soup is an example of a good first meal.  Heavy or fried foods are harder to digest and may make you feel nauseous or bloated.  Likewise meals heavy in dairy and vegetables can cause extra gas to form and this can also increase the bloating.  Drink plenty of fluids but you should avoid alcoholic beverages for 24 hours.  ACTIVITY: Your care partner should take you home directly after the procedure.  You should plan to take it easy, moving slowly for the rest of the day.  You can resume normal activity the day after the procedure however you should NOT DRIVE or use heavy machinery for 24 hours (because of the sedation medicines used during the test).    SYMPTOMS TO REPORT IMMEDIATELY: A  gastroenterologist can be reached at any hour.  During normal business hours, 8:30 AM to 5:00 PM Monday through Friday, call [phone].  After hours and on weekends, please call the GI answering service at [phone] who will take a message and have the physician on call contact you.   Following lower endoscopy (colonoscopy or flexible sigmoidoscopy):  Excessive amounts of blood in the stool  Significant tenderness or worsening of abdominal pains  Swelling of the abdomen that is new, acute  Fever of 100F or higher  FOLLOW UP: If any biopsies were taken you will be contacted by phone or by letter within the next 1-3 weeks.  Call your gastroenterologist if you have not heard about the biopsies in 3 weeks.  Our staff will call the home number listed on your records the next business day following your procedure to check on you and address any questions or concerns that you may have at that time regarding the information given to you following your procedure. This is a courtesy call and so if there is no answer at the home number and we have not heard from you through the emergency physician on call, we will assume that you have returned to your regular daily activities without incident.  SIGNATURES/CONFIDENTIALITY: You and/or your care partner have signed paperwork which will be entered into your electronic medical record.  These signatures attest to the fact that that the information above  on your After Visit Summary has been reviewed and is understood.  Full responsibility of the confidentiality of this discharge information lies with you and/or your care-partner.

## 2011-08-27 NOTE — Progress Notes (Signed)
Propofol per k rogers crna, see scanned intra procedure report. All meds titrated during procedure per crna. ewm

## 2011-08-27 NOTE — Op Note (Signed)
[facility] [address] [place], [address]   COLONOSCOPY PROCEDURE REPORT  PATIENT: Aviendha, Azbell  MR#: [id number] BIRTHDATE: 14-Dec-1960 , 51  yrs. old GENDER: Female ENDOSCOPIST: Roxy Cedar, MD REFERRED BY:.Direct Self PROCEDURE DATE:  08/27/2011 PROCEDURE:   Colonoscopy with snare polypectomy ASA CLASS:   Class II INDICATIONS:high risk patient with personal history of colonic polyps and ; colon in [facility] 6 yrs ago w/ "polyps".  Told to f/u 5 yrs. MEDICATIONS: MAC sedation, administered by CRNA and propofol (Diprivan) 400mg  IV  DESCRIPTION OF PROCEDURE:   After the risks benefits and alternatives of the procedure were thoroughly explained, informed consent was obtained.  A digital rectal exam revealed external hemorrhoids.   The LB CF-[id number]  endoscope was introduced through the anus and advanced to the cecum, which was identified by both the appendix and ileocecal valve. No adverse events experienced.   The quality of the prep was excellent, using MoviPrep  The instrument was then slowly withdrawn as the colon was fully examined.      COLON FINDINGS: A diminutive polyp was found in the sigmoid colon. A polypectomy was performed with a cold snare.  The resection was complete and the polyp tissue was completely retrieved.   Moderate diverticulosis was noted in the sigmoid colon. sigmoid colon stenosis / fixed from adhesions.  The colon mucosa was otherwise normal.  Retroflexed views revealed internal hemorrhoids. The time to cecum=8.07 minutes  Withdrawal time=11.17 minutes.  The scope was withdrawn and the procedure completed.  COMPLICATIONS: There were no complications.  ENDOSCOPIC IMPRESSION: 1.   Diminutive polyp was found in the sigmoid colon; polypectomy was performed with a cold snare 2.   Moderate diverticulosis was noted in the sigmoid colon with stenosis 3.   The colon mucosa was otherwise  normal  RECOMMENDATIONS: Follow up colonoscopy in 5 years (PEDS scope)  eSigned:  Roxy Cedar, MD 08/27/2011 9:04 AM   cc: Gordy Savers, MD The Patient   PATIENT NAME:  Kenzy, Campoverde MR#: [id number]

## 2011-08-28 ENCOUNTER — Telehealth: Payer: Self-pay | Admitting: *Deleted

## 2011-08-28 NOTE — Telephone Encounter (Signed)
  Follow up Call-  Call back number 08/27/2011  Post procedure Call Back phone  # [phone]  Permission to leave phone message Yes     Patient questions:  Do you have a fever, pain , or abdominal swelling? no Pain Score  0 *  Have you tolerated food without any problems? yes  Have you been able to return to your normal activities? yes  Do you have any questions about your discharge instructions: Diet   no Medications  no Follow up visit  no  Do you have questions or concerns about your Care? no  Actions: * If pain score is 4 or above: No action needed, pain <4.

## 2011-09-01 ENCOUNTER — Encounter: Payer: Self-pay | Admitting: Internal Medicine

## 2011-11-27 ENCOUNTER — Other Ambulatory Visit: Payer: Self-pay | Admitting: Orthopedic Surgery

## 2011-11-27 DIAGNOSIS — D179 Benign lipomatous neoplasm, unspecified: Secondary | ICD-10-CM

## 2011-12-01 ENCOUNTER — Ambulatory Visit
Admission: RE | Admit: 2011-12-01 | Discharge: 2011-12-01 | Disposition: A | Discharge status: Home / Self Care | Payer: Managed Care, Other (non HMO) | Source: Ambulatory Visit | Attending: Orthopedic Surgery | Admitting: Orthopedic Surgery

## 2011-12-01 DIAGNOSIS — D179 Benign lipomatous neoplasm, unspecified: Secondary | ICD-10-CM

## 2011-12-01 MED ORDER — GADOBENATE DIMEGLUMINE 529 MG/ML IV SOLN
6.0000 mL | Freq: Once | INTRAVENOUS | Status: AC | PRN
  Administered 2011-12-01: 6 mL via INTRAVENOUS

## 2011-12-15 ENCOUNTER — Other Ambulatory Visit (HOSPITAL_COMMUNITY): Payer: Self-pay | Admitting: Orthopedic Surgery

## 2011-12-16 ENCOUNTER — Encounter (HOSPITAL_COMMUNITY): Payer: Self-pay | Admitting: Pharmacy Technician

## 2011-12-22 ENCOUNTER — Encounter (HOSPITAL_COMMUNITY)
Admission: RE | Admit: 2011-12-22 | Discharge: 2011-12-22 | Disposition: A | Discharge status: Home / Self Care | Payer: Managed Care, Other (non HMO) | Source: Ambulatory Visit | Attending: Anesthesiology | Admitting: Anesthesiology

## 2011-12-22 ENCOUNTER — Encounter (HOSPITAL_COMMUNITY): Payer: Self-pay

## 2011-12-22 ENCOUNTER — Encounter (HOSPITAL_COMMUNITY)
Admission: RE | Admit: 2011-12-22 | Discharge: 2011-12-22 | Disposition: A | Discharge status: Home / Self Care | Payer: Managed Care, Other (non HMO) | Source: Ambulatory Visit | Attending: Orthopedic Surgery | Admitting: Orthopedic Surgery

## 2011-12-22 HISTORY — DX: Diverticulosis of intestine, part unspecified, without perforation or abscess without bleeding: K57.90

## 2011-12-22 HISTORY — DX: Anemia, unspecified: D64.9

## 2011-12-22 LAB — COMPREHENSIVE METABOLIC PANEL
ALT: 11 U/L (ref 0–35)
AST: 23 U/L (ref 0–37)
Albumin: 4.2 g/dL (ref 3.5–5.2)
Alkaline Phosphatase: 82 U/L (ref 39–117)
BUN: 12 mg/dL (ref 6–23)
CO2: 27 mEq/L (ref 19–32)
Calcium: 9.5 mg/dL (ref 8.4–10.5)
Chloride: 101 mEq/L (ref 96–112)
Creatinine, Ser: 0.64 mg/dL (ref 0.50–1.10)
GFR calc Af Amer: >90 mL/min (ref >90)
GFR calc non Af Amer: >90 mL/min (ref >90)
Glucose, Bld: 85 mg/dL (ref 70–99)
Potassium: 4.5 mEq/L (ref 3.5–5.1)
Sodium: 137 mEq/L (ref 135–145)
Total Bilirubin: 0.4 mg/dL (ref 0.3–1.2)
Total Protein: 7.1 g/dL (ref 6.0–8.3)

## 2011-12-22 LAB — SURGICAL PCR SCREEN
MRSA, PCR: NEGATIVE
Staphylococcus aureus: NEGATIVE

## 2011-12-22 LAB — CBC
HCT: 40.2 % (ref 36.0–46.0)
Hemoglobin: 13.4 g/dL (ref 12.0–15.0)
MCH: 30.6 pg (ref 26.0–34.0)
MCHC: 33.3 g/dL (ref 30.0–36.0)
MCV: 91.8 fL (ref 78.0–100.0)
Platelets: 292 10*3/uL (ref 150–400)
RBC: 4.38 MIL/uL (ref 3.87–5.11)
RDW: 13.2 % (ref 11.5–15.5)
WBC: 10.1 10*3/uL (ref 4.0–10.5)

## 2011-12-22 LAB — APTT: aPTT: 32 seconds (ref 24–37)

## 2011-12-22 LAB — PROTIME-INR
INR: 1.06 (ref 0.00–1.49)
Prothrombin Time: 13.7 seconds (ref 11.6–15.2)

## 2011-12-22 NOTE — Pre-Procedure Instructions (Signed)
[address] Susan Aguirre  12/22/2011   Your procedure is scheduled on:  Wednesday December 24, 2011  Report to [facility] at 7:35 AM.  Call this number if you have problems the morning of surgery: [phone]   Remember:   Do not eat food or drink :After Midnight.    Take these medicines the morning of surgery with A SIP OF WATER: xanax   Do not wear jewelry, make-up or nail polish.  Do not wear lotions, powders, or perfumes.  Do not shave 48 hours prior to surgery.  Do not bring valuables to the hospital.  Contacts, dentures or bridgework may not be worn into surgery.  Leave suitcase in the car. After surgery it may be brought to your room.  For patients admitted to the hospital, checkout time is 11:00 AM the day of discharge.   Patients discharged the day of surgery will not be allowed to drive home.  Name and phone number of your driver: family / friend  Special Instructions: Shower using CHG 2 nights before surgery and the night before surgery.  If you shower the day of surgery use CHG.  Use special wash - you have one bottle of CHG for all showers.  You should use approximately 1/3 of the bottle for each shower.   Please read over the following fact sheets that you were given: Pain Booklet, Coughing and Deep Breathing, MRSA Information and Surgical Site Infection Prevention

## 2011-12-23 MED ORDER — CEFAZOLIN SODIUM-DEXTROSE 2-3 GM-% IV SOLR
2.0000 g | INTRAVENOUS | Status: AC
  Administered 2011-12-24: 2 g via INTRAVENOUS
  Filled 2011-12-23: qty 50

## 2011-12-24 ENCOUNTER — Encounter (HOSPITAL_COMMUNITY)
Admission: RE | Disposition: A | Discharge status: Home / Self Care | Payer: Self-pay | Source: Ambulatory Visit | Attending: Orthopedic Surgery

## 2011-12-24 ENCOUNTER — Encounter (HOSPITAL_COMMUNITY): Payer: Self-pay | Admitting: *Deleted

## 2011-12-24 ENCOUNTER — Ambulatory Visit (HOSPITAL_COMMUNITY): Payer: Managed Care, Other (non HMO) | Admitting: *Deleted

## 2011-12-24 ENCOUNTER — Ambulatory Visit (HOSPITAL_COMMUNITY)
Admission: RE | Admit: 2011-12-24 | Discharge: 2011-12-24 | Disposition: A | Discharge status: Home / Self Care | Payer: Managed Care, Other (non HMO) | Source: Ambulatory Visit | Attending: Orthopedic Surgery | Admitting: Orthopedic Surgery

## 2011-12-24 DIAGNOSIS — Z807 Family history of other malignant neoplasms of lymphoid, hematopoietic and related tissues: Secondary | ICD-10-CM | POA: Insufficient documentation

## 2011-12-24 DIAGNOSIS — J45909 Unspecified asthma, uncomplicated: Secondary | ICD-10-CM | POA: Insufficient documentation

## 2011-12-24 DIAGNOSIS — F411 Generalized anxiety disorder: Secondary | ICD-10-CM | POA: Insufficient documentation

## 2011-12-24 DIAGNOSIS — M129 Arthropathy, unspecified: Secondary | ICD-10-CM | POA: Insufficient documentation

## 2011-12-24 DIAGNOSIS — R2240 Localized swelling, mass and lump, unspecified lower limb: Secondary | ICD-10-CM

## 2011-12-24 DIAGNOSIS — L408 Other psoriasis: Secondary | ICD-10-CM | POA: Insufficient documentation

## 2011-12-24 DIAGNOSIS — Z01812 Encounter for preprocedural laboratory examination: Secondary | ICD-10-CM | POA: Insufficient documentation

## 2011-12-24 DIAGNOSIS — Z90710 Acquired absence of both cervix and uterus: Secondary | ICD-10-CM | POA: Insufficient documentation

## 2011-12-24 DIAGNOSIS — Z833 Family history of diabetes mellitus: Secondary | ICD-10-CM | POA: Insufficient documentation

## 2011-12-24 DIAGNOSIS — Z01818 Encounter for other preprocedural examination: Secondary | ICD-10-CM | POA: Insufficient documentation

## 2011-12-24 DIAGNOSIS — Z860100 Personal history of colon polyps, unspecified: Secondary | ICD-10-CM | POA: Insufficient documentation

## 2011-12-24 DIAGNOSIS — Z9089 Acquired absence of other organs: Secondary | ICD-10-CM | POA: Insufficient documentation

## 2011-12-24 DIAGNOSIS — D2370 Other benign neoplasm of skin of unspecified lower limb, including hip: Secondary | ICD-10-CM | POA: Insufficient documentation

## 2011-12-24 DIAGNOSIS — Z8249 Family history of ischemic heart disease and other diseases of the circulatory system: Secondary | ICD-10-CM | POA: Insufficient documentation

## 2011-12-24 HISTORY — PX: MASS EXCISION: SHX2000

## 2011-12-24 SURGERY — EXCISION MASS
Anesthesia: General | Site: Foot | Laterality: Right | Wound class: Clean

## 2011-12-24 MED ORDER — PROPOFOL 10 MG/ML IV BOLUS
INTRAVENOUS | Status: DC | PRN
  Administered 2011-12-24: 200 mg via INTRAVENOUS

## 2011-12-24 MED ORDER — BUPIVACAINE HCL (PF) 0.5 % IJ SOLN
INTRAMUSCULAR | Status: AC
  Filled 2011-12-24: qty 30

## 2011-12-24 MED ORDER — HYDROCODONE-ACETAMINOPHEN 5-500 MG PO TABS: 1.0000 | ORAL_TABLET | Freq: Four times a day (QID) | ORAL | Status: DC | PRN

## 2011-12-24 MED ORDER — LIDOCAINE HCL (CARDIAC) 20 MG/ML IV SOLN
INTRAVENOUS | Status: DC | PRN
  Administered 2011-12-24: 60 mg via INTRAVENOUS

## 2011-12-24 MED ORDER — FENTANYL CITRATE 0.05 MG/ML IJ SOLN
INTRAMUSCULAR | Status: DC | PRN
  Administered 2011-12-24 (×2): 50 ug via INTRAVENOUS

## 2011-12-24 MED ORDER — PROMETHAZINE HCL 25 MG/ML IJ SOLN: 6.2500 mg | INTRAMUSCULAR | Status: DC | PRN

## 2011-12-24 MED ORDER — MIDAZOLAM HCL 5 MG/5ML IJ SOLN
INTRAMUSCULAR | Status: DC | PRN
  Administered 2011-12-24: 2 mg via INTRAVENOUS

## 2011-12-24 MED ORDER — KETOROLAC TROMETHAMINE 30 MG/ML IJ SOLN: 15.0000 mg | Freq: Once | INTRAMUSCULAR | Status: DC | PRN

## 2011-12-24 MED ORDER — LACTATED RINGERS IV SOLN
INTRAVENOUS | Status: DC
  Administered 2011-12-24: 09:00:00 via INTRAVENOUS

## 2011-12-24 MED ORDER — MEPERIDINE HCL 25 MG/ML IJ SOLN: 6.2500 mg | INTRAMUSCULAR | Status: DC | PRN

## 2011-12-24 MED ORDER — LACTATED RINGERS IV SOLN
INTRAVENOUS | Status: DC | PRN
  Administered 2011-12-24: 09:00:00 via INTRAVENOUS

## 2011-12-24 MED ORDER — BUPIVACAINE HCL (PF) 0.5 % IJ SOLN
INTRAMUSCULAR | Status: DC | PRN
  Administered 2011-12-24: 10 mL

## 2011-12-24 MED ORDER — MIDAZOLAM HCL 2 MG/2ML IJ SOLN: 0.5000 mg | Freq: Once | INTRAMUSCULAR | Status: DC | PRN

## 2011-12-24 MED ORDER — 0.9 % SODIUM CHLORIDE (POUR BTL) OPTIME
TOPICAL | Status: DC | PRN
  Administered 2011-12-24: 1000 mL

## 2011-12-24 MED ORDER — FENTANYL CITRATE 0.05 MG/ML IJ SOLN: 25.0000 ug | INTRAMUSCULAR | Status: DC | PRN

## 2011-12-24 SURGICAL SUPPLY — 42 items
BANDAGE GAUZE 4  KLING STR (GAUZE/BANDAGES/DRESSINGS) IMPLANT
BANDAGE GAUZE ELAST BULKY 4 IN (GAUZE/BANDAGES/DRESSINGS) ×1 IMPLANT
BNDG CMPR 9X4 STRL LF SNTH (GAUZE/BANDAGES/DRESSINGS) ×1
BNDG COHESIVE 6X5 TAN STRL LF (GAUZE/BANDAGES/DRESSINGS) ×1 IMPLANT
BNDG ESMARK 4X9 LF (GAUZE/BANDAGES/DRESSINGS) ×2 IMPLANT
BNDG GAUZE STRTCH 6 (GAUZE/BANDAGES/DRESSINGS) IMPLANT
CLOTH BEACON ORANGE TIMEOUT ST (SAFETY) ×2 IMPLANT
CORDS BIPOLAR (ELECTRODE) IMPLANT
COVER SURGICAL LIGHT HANDLE (MISCELLANEOUS) ×2 IMPLANT
DRAPE U-SHAPE 47X51 STRL (DRAPES) ×2 IMPLANT
DRSG ADAPTIC 3X8 NADH LF (GAUZE/BANDAGES/DRESSINGS) IMPLANT
DRSG EMULSION OIL 3X3 NADH (GAUZE/BANDAGES/DRESSINGS) ×1 IMPLANT
DRSG PAD ABDOMINAL 8X10 ST (GAUZE/BANDAGES/DRESSINGS) ×1 IMPLANT
DURAPREP 26ML APPLICATOR (WOUND CARE) ×2 IMPLANT
ELECT REM PT RETURN 9FT ADLT (ELECTROSURGICAL) ×2
ELECTRODE REM PT RTRN 9FT ADLT (ELECTROSURGICAL) ×1 IMPLANT
GLOVE BIOGEL PI IND STRL 9 (GLOVE) ×1 IMPLANT
GLOVE BIOGEL PI INDICATOR 9 (GLOVE) ×1
GLOVE SURG ORTHO 9.0 STRL STRW (GLOVE) ×2 IMPLANT
GOWN PREVENTION PLUS XLARGE (GOWN DISPOSABLE) ×2 IMPLANT
GOWN SRG XL XLNG 56XLVL 4 (GOWN DISPOSABLE) ×1 IMPLANT
GOWN STRL NON-REIN XL XLG LVL4 (GOWN DISPOSABLE) ×2
KIT BASIN OR (CUSTOM PROCEDURE TRAY) ×2 IMPLANT
KIT ROOM TURNOVER OR (KITS) ×2 IMPLANT
MANIFOLD NEPTUNE II (INSTRUMENTS) ×2 IMPLANT
NDL HYPO 25GX1X1/2 BEV (NEEDLE) IMPLANT
NEEDLE HYPO 25GX1X1/2 BEV (NEEDLE) IMPLANT
NS IRRIG 1000ML POUR BTL (IV SOLUTION) ×2 IMPLANT
PACK ORTHO EXTREMITY (CUSTOM PROCEDURE TRAY) ×2 IMPLANT
PAD ARMBOARD 7.5X6 YLW CONV (MISCELLANEOUS) ×4 IMPLANT
PAD CAST 4YDX4 CTTN HI CHSV (CAST SUPPLIES) IMPLANT
PADDING CAST COTTON 4X4 STRL (CAST SUPPLIES)
SPECIMEN JAR SMALL (MISCELLANEOUS) ×2 IMPLANT
SPONGE GAUZE 4X4 12PLY (GAUZE/BANDAGES/DRESSINGS) ×1 IMPLANT
SUCTION FRAZIER TIP 10 FR DISP (SUCTIONS) IMPLANT
SUT ETHILON 2 0 PSLX (SUTURE) IMPLANT
SUT VIC AB 2-0 FS1 27 (SUTURE) IMPLANT
SYR CONTROL 10ML LL (SYRINGE) IMPLANT
TOWEL OR 17X24 6PK STRL BLUE (TOWEL DISPOSABLE) ×2 IMPLANT
TOWEL OR 17X26 10 PK STRL BLUE (TOWEL DISPOSABLE) ×2 IMPLANT
TUBE CONNECTING 12X1/4 (SUCTIONS) IMPLANT
WATER STERILE IRR 1000ML POUR (IV SOLUTION) ×2 IMPLANT

## 2011-12-24 NOTE — Op Note (Signed)
OPERATIVE REPORT  DATE OF SURGERY: 12/24/2011  PATIENT:  Susan Aguirre,  51 y.o. female  PRE-OPERATIVE DIAGNOSIS:  Soft Tissue Mass Right Foot  POST-OPERATIVE DIAGNOSIS:  Soft Tissue Mass Right Foot  PROCEDURE:  Procedure(s): EXCISION MASS right foot plantar aspect near the heel pad.  SURGEON:  Surgeon(s): Nadara Mustard, MD  ANESTHESIA:   general  EBL:  Minimal ML  SPECIMEN:  Source of Specimen:  Soft tissue mass right foot heel pad.  TOURNIQUET:  * No tourniquets in log *  PROCEDURE DETAILS: Patient is a 51 year old woman who has a painful soft tissue mass just distal to the heel pad on the right. Patient has had an MRI scan which shows that this is not a malignancy she has pain with weightbearing has failed conservative treatment and wished to have the soft tissue mass excised. Risks and benefits of surgery were discussed including infection neurovascular injury potential for malignancy. Patient states she understands was pursued this time. Description of procedure patient was brought to the operating room underwent general anesthetic. After adequate levels of anesthesia obtained patient's right lower extremity was prepped using DuraPrep and draped into a sterile field. A plantar medial incision was made near the heel pad. This was made off the plantar aspect the skin so as not to have scar tissue and the plantar aspect of the foot. Dissection was carried down to the soft tissue mass and this was excised in one block of tissue. It is approximately 2 cm in diameter. The wounds irrigated normal saline hemostasis was obtained. This started was a benign-appearing soft tissue firm mass. It was not attached to the plantar fascia no evidence of plantar fibromatosis. The skin was closed using 2-0 nylon in the wound was covered with Adaptic orthopedic sponges AB dressing Kerlix and Coban. Patient was extubated taken to the PACU in stable condition plan for discharge to home.  PLAN OF  CARE: Discharge to home after PACU  PATIENT DISPOSITION:  PACU - hemodynamically stable.   Nadara Mustard, MD 12/24/2011 11:11 AM

## 2011-12-24 NOTE — Anesthesia Preprocedure Evaluation (Addendum)
Anesthesia Evaluation  Patient identified by MRN, date of birth, ID band Patient awake    Reviewed: Allergy & Precautions, H&P , NPO status , Patient's Chart, lab work & pertinent test results, reviewed documented beta blocker date and time   History of Anesthesia Complications Negative for: history of anesthetic complications  Airway Mallampati: II TM Distance: >3 FB Neck ROM: full    Dental No notable dental hx. (+) Teeth Intact, Caps and Dental Advisory Given   Pulmonary neg pulmonary ROS, asthma , Current Smoker,  breath sounds clear to auscultation  Pulmonary exam normal       Cardiovascular Exercise Tolerance: Good negative cardio ROS  Rhythm:regular Rate:Normal     Neuro/Psych PSYCHIATRIC DISORDERS Anxiety Depression negative neurological ROS  negative psych ROS   GI/Hepatic negative GI ROS, Neg liver ROS,   Endo/Other  negative endocrine ROS  Renal/GU negative Renal ROS     Musculoskeletal   Abdominal   Peds  Hematology negative hematology ROS (+)   Anesthesia Other Findings   Reproductive/Obstetrics negative OB ROS                          Anesthesia Physical Anesthesia Plan  ASA: II  Anesthesia Plan: General LMA   Post-op Pain Management:    Induction:   Airway Management Planned:   Additional Equipment:   Intra-op Plan:   Post-operative Plan:   Informed Consent: I have reviewed the patients History and Physical, chart, labs and discussed the procedure including the risks, benefits and alternatives for the proposed anesthesia with the patient or authorized representative who has indicated his/her understanding and acceptance.   Dental Advisory Given  Plan Discussed with: CRNA, Surgeon and Anesthesiologist  Anesthesia Plan Comments:         Anesthesia Quick Evaluation

## 2011-12-24 NOTE — Anesthesia Postprocedure Evaluation (Signed)
Anesthesia Post Note  Patient: Susan Aguirre  Procedure(s) Performed: Procedure(s) (LRB): EXCISION MASS (Right)  Anesthesia type: GA  Patient location: PACU  Post pain: Pain level controlled  Post assessment: Post-op Vital signs reviewed  Last Vitals:  Filed Vitals:   12/24/11 1045  BP:   Pulse: 73  Temp:   Resp: 17    Post vital signs: Reviewed  Level of consciousness: sedated  Complications: No apparent anesthesia complications

## 2011-12-24 NOTE — Progress Notes (Signed)
Orthopedic Tech Progress Note Patient Details:  Susan Aguirre [facility] Oct 17, 1960 [id number]  Ortho Devices Type of Ortho Device: Postop shoe/boot Ortho Device/Splint Location: RIGHT POST OP SHOE Ortho Device/Splint Interventions: Application   Cammer, Mickie Bail 12/24/2011, 11:45 AM

## 2011-12-24 NOTE — Progress Notes (Signed)
Report given to sharon rn as caregiver 

## 2011-12-24 NOTE — H&P (Signed)
Susan Aguirre is an 51 y.o. female.   Chief Complaint: Soft tissue mass right foot HPI: Patient is a 51 year old woman who has had a chronic soft tissue mass right foot. MRI scan has been obtained and shows no malignancy. Patient has pain with activities of daily living and presents at this time for excision of the soft tissue mass.  Past Medical History  Diagnosis Date  . Endometriosis   . Psoriasis   . Calcification of aorta     Abdominal aorta  . CIN I (cervical intraepithelial neoplasia I)   . Hemorrhoid   . Dysplastic colon polyp   . Anxiety   . Seasonal allergies   . Depression   . Arthritis   . Anemia     "borderline"  . Diverticulosis   . Asthma     "bronchial asthmatic, flares up seasonally"    Past Surgical History  Procedure Date  . Oophorectomy 2002    DX LAP W/RSO  . Oophorectomy 2000    TAH W LSO  . Abdominal hysterectomy 2000    TAH,LSO (RSO IN 2002)  . Appendectomy 1982    LYSIS OF BOWEL ADHESIONS  . Colposcopy   . Gynecologic cryosurgery   . Pelvic laparoscopy     DL  . Tonsillectomy   . Colonoscopy   . Polypectomy   . Colon surgery     "small amount of colon taken at time of oophorectomy in 2002"  . Lipoma excision     removed from back and right foot"done at different times"    Family History  Problem Relation Age of Onset  . Diabetes Mother   . Hypertension Mother   . Lymphoma Father   . Cancer Father   . Diabetes Maternal Grandmother   . Hypertension Maternal Grandmother   . Heart disease Maternal Grandmother    Social History:  reports that she has been smoking Cigarettes.  She has been smoking about .5 packs per day. She has never used smokeless tobacco. She reports that she drinks about 3 ounces of alcohol per week. She reports that she does not use illicit drugs.  Allergies:  Allergies  Allergen Reactions  . Codeine Nausea Only    No prescriptions prior to admission    Results for orders placed during the hospital  encounter of 12/22/11 (from the past 48 hour(s))  APTT     Status: Normal   Collection Time   12/22/11  2:55 PM      Component Value Range Comment   aPTT 32  24 - 37 seconds   CBC     Status: Normal   Collection Time   12/22/11  2:55 PM      Component Value Range Comment   WBC 10.1  4.0 - 10.5 K/uL    RBC 4.38  3.87 - 5.11 MIL/uL    Hemoglobin 13.4  12.0 - 15.0 g/dL    HCT 16.1  09.6 - 04.5 %    MCV 91.8  78.0 - 100.0 fL    MCH 30.6  26.0 - 34.0 pg    MCHC 33.3  30.0 - 36.0 g/dL    RDW 40.9  81.1 - 91.4 %    Platelets 292  150 - 400 K/uL   COMPREHENSIVE METABOLIC PANEL     Status: Normal   Collection Time   12/22/11  2:55 PM      Component Value Range Comment   Sodium 137  135 - 145 mEq/L    Potassium  4.5  3.5 - 5.1 mEq/L    Chloride 101  96 - 112 mEq/L    CO2 27  19 - 32 mEq/L    Glucose, Bld 85  70 - 99 mg/dL    BUN 12  6 - 23 mg/dL    Creatinine, Ser 1.61  0.50 - 1.10 mg/dL    Calcium 9.5  8.4 - 09.6 mg/dL    Total Protein 7.1  6.0 - 8.3 g/dL    Albumin 4.2  3.5 - 5.2 g/dL    AST 23  0 - 37 U/L    ALT 11  0 - 35 U/L    Alkaline Phosphatase 82  39 - 117 U/L    Total Bilirubin 0.4  0.3 - 1.2 mg/dL    GFR calc non Af Amer >90  >90 mL/min    GFR calc Af Amer >90  >90 mL/min   PROTIME-INR     Status: Normal   Collection Time   12/22/11  2:55 PM      Component Value Range Comment   Prothrombin Time 13.7  11.6 - 15.2 seconds    INR 1.06  0.00 - 1.49   SURGICAL PCR SCREEN     Status: Normal   Collection Time   12/22/11  2:56 PM      Component Value Range Comment   MRSA, PCR NEGATIVE  NEGATIVE    Staphylococcus aureus NEGATIVE  NEGATIVE    Dg Chest 2 View  12/22/2011  *RADIOLOGY REPORT*  Clinical Data: Asthma  CHEST - 2 VIEW  Comparison: None.  Findings: There is a degree of underlying emphysematous change.  No edema or consolidation.  Pulmonary vascularity reflects underlying emphysema.  Heart size is normal.  No bone lesions.  No pneumothorax.  No adenopathy.   Impression:  Underlying emphysema.  No edema or consolidation.   Original Report Authenticated By: Bretta Bang, M.D.     Review of Systems  All other systems reviewed and are negative.    There were no vitals taken for this visit. Physical Exam  On examination patient has palpable dorsalis pedis pulse she has a soft mobile soft tissue mass. MRI scan was negative for malignancy. The soft tissue mass is painful with her activities of daily living. Assessment/Plan Assessment: Right foot soft tissue mass.  Plan: Will plan for excision of the soft tissue mass risks and benefits were discussed including infection neurovascular injury potential for malignancy potential for an additional surgery or recurrence. Patient states she understands and wished to proceed at this time.  Lenwood Balsam V 12/24/2011, 6:25 AM

## 2011-12-24 NOTE — Transfer of Care (Signed)
Immediate Anesthesia Transfer of Care Note  Patient: Susan Aguirre  Procedure(s) Performed: Procedure(s) (LRB) with comments: EXCISION MASS (Right) - Right Foot Excision Soft Tissue Mass  Patient Location: PACU  Anesthesia Type:General  Level of Consciousness: awake, alert  and oriented  Airway & Oxygen Therapy: Patient Spontanous Breathing  Post-op Assessment: Report given to PACU RN and Post -op Vital signs reviewed and stable  Post vital signs: Reviewed and stable  Complications: No apparent anesthesia complications

## 2011-12-24 NOTE — Preoperative (Signed)
Beta Blockers   Reason not to administer Beta Blockers:Not Applicable 

## 2011-12-25 ENCOUNTER — Encounter (HOSPITAL_COMMUNITY): Payer: Self-pay | Admitting: Orthopedic Surgery

## 2012-03-25 ENCOUNTER — Encounter (HOSPITAL_COMMUNITY): Payer: Self-pay | Admitting: Emergency Medicine

## 2012-03-25 ENCOUNTER — Emergency Department (HOSPITAL_COMMUNITY): Payer: Managed Care, Other (non HMO)

## 2012-03-25 DIAGNOSIS — Z8742 Personal history of other diseases of the female genital tract: Secondary | ICD-10-CM | POA: Insufficient documentation

## 2012-03-25 DIAGNOSIS — Z79899 Other long term (current) drug therapy: Secondary | ICD-10-CM | POA: Insufficient documentation

## 2012-03-25 DIAGNOSIS — Z860100 Personal history of colon polyps, unspecified: Secondary | ICD-10-CM | POA: Insufficient documentation

## 2012-03-25 DIAGNOSIS — R072 Precordial pain: Secondary | ICD-10-CM | POA: Insufficient documentation

## 2012-03-25 DIAGNOSIS — Z8739 Personal history of other diseases of the musculoskeletal system and connective tissue: Secondary | ICD-10-CM | POA: Insufficient documentation

## 2012-03-25 DIAGNOSIS — J45909 Unspecified asthma, uncomplicated: Secondary | ICD-10-CM | POA: Insufficient documentation

## 2012-03-25 DIAGNOSIS — Z7982 Long term (current) use of aspirin: Secondary | ICD-10-CM | POA: Insufficient documentation

## 2012-03-25 DIAGNOSIS — Z872 Personal history of diseases of the skin and subcutaneous tissue: Secondary | ICD-10-CM | POA: Insufficient documentation

## 2012-03-25 DIAGNOSIS — Z8541 Personal history of malignant neoplasm of cervix uteri: Secondary | ICD-10-CM | POA: Insufficient documentation

## 2012-03-25 DIAGNOSIS — Z8679 Personal history of other diseases of the circulatory system: Secondary | ICD-10-CM | POA: Insufficient documentation

## 2012-03-25 DIAGNOSIS — F411 Generalized anxiety disorder: Secondary | ICD-10-CM | POA: Insufficient documentation

## 2012-03-25 DIAGNOSIS — Z862 Personal history of diseases of the blood and blood-forming organs and certain disorders involving the immune mechanism: Secondary | ICD-10-CM | POA: Insufficient documentation

## 2012-03-25 DIAGNOSIS — F17200 Nicotine dependence, unspecified, uncomplicated: Secondary | ICD-10-CM | POA: Insufficient documentation

## 2012-03-25 DIAGNOSIS — Z8659 Personal history of other mental and behavioral disorders: Secondary | ICD-10-CM | POA: Insufficient documentation

## 2012-03-25 DIAGNOSIS — Z8719 Personal history of other diseases of the digestive system: Secondary | ICD-10-CM | POA: Insufficient documentation

## 2012-03-25 DIAGNOSIS — I700 Atherosclerosis of aorta: Secondary | ICD-10-CM | POA: Insufficient documentation

## 2012-03-25 LAB — CBC WITH DIFFERENTIAL/PLATELET
Basophils Absolute: 0 10*3/uL (ref 0.0–0.1)
Basophils Relative: 0 % (ref 0–1)
Eosinophils Absolute: 0.2 10*3/uL (ref 0.0–0.7)
Eosinophils Relative: 2 % (ref 0–5)
HCT: 39.3 % (ref 36.0–46.0)
Hemoglobin: 13.8 g/dL (ref 12.0–15.0)
Lymphocytes Relative: 43 % (ref 12–46)
Lymphs Abs: 4.2 10*3/uL — ABNORMAL HIGH (ref 0.7–4.0)
MCH: 31.3 pg (ref 26.0–34.0)
MCHC: 35.1 g/dL (ref 30.0–36.0)
MCV: 89.1 fL (ref 78.0–100.0)
Monocytes Absolute: 0.7 10*3/uL (ref 0.1–1.0)
Monocytes Relative: 7 % (ref 3–12)
Neutro Abs: 4.7 10*3/uL (ref 1.7–7.7)
Neutrophils Relative %: 48 % (ref 43–77)
Platelets: 269 10*3/uL (ref 150–400)
RBC: 4.41 MIL/uL (ref 3.87–5.11)
RDW: 13.2 % (ref 11.5–15.5)
WBC: 9.7 10*3/uL (ref 4.0–10.5)

## 2012-03-25 LAB — POCT I-STAT TROPONIN I: Troponin i, poc: 0 ng/mL (ref 0.00–0.08)

## 2012-03-25 LAB — COMPREHENSIVE METABOLIC PANEL
ALT: 8 U/L (ref 0–35)
AST: 20 U/L (ref 0–37)
Albumin: 3.9 g/dL (ref 3.5–5.2)
Alkaline Phosphatase: 77 U/L (ref 39–117)
BUN: 12 mg/dL (ref 6–23)
CO2: 29 mEq/L (ref 19–32)
Calcium: 9.5 mg/dL (ref 8.4–10.5)
Chloride: 100 mEq/L (ref 96–112)
Creatinine, Ser: 0.73 mg/dL (ref 0.50–1.10)
GFR calc Af Amer: >90 mL/min (ref >90)
GFR calc non Af Amer: >90 mL/min (ref >90)
Glucose, Bld: 106 mg/dL — ABNORMAL HIGH (ref 70–99)
Potassium: 3.2 mEq/L — ABNORMAL LOW (ref 3.5–5.1)
Sodium: 140 mEq/L (ref 135–145)
Total Bilirubin: 0.4 mg/dL (ref 0.3–1.2)
Total Protein: 6.9 g/dL (ref 6.0–8.3)

## 2012-03-25 NOTE — ED Notes (Signed)
Pt st's she developed mid chest pain approx 4pm.  Pt describes as sharpe.   Pt st's it hurts to breath.

## 2012-03-26 ENCOUNTER — Emergency Department (HOSPITAL_COMMUNITY)
Admission: EM | Admit: 2012-03-26 | Discharge: 2012-03-26 | Disposition: A | Discharge status: Home / Self Care | Payer: Managed Care, Other (non HMO) | Attending: Emergency Medicine | Admitting: Emergency Medicine

## 2012-03-26 DIAGNOSIS — R079 Chest pain, unspecified: Secondary | ICD-10-CM

## 2012-03-26 LAB — POCT I-STAT TROPONIN I: Troponin i, poc: 0 ng/mL (ref 0.00–0.08)

## 2012-03-26 MED ORDER — GI COCKTAIL ~~LOC~~
30.0000 mL | Freq: Once | ORAL | Status: AC
  Administered 2012-03-26: 30 mL via ORAL
  Filled 2012-03-26: qty 30

## 2012-03-26 MED ORDER — ESOMEPRAZOLE MAGNESIUM 40 MG PO CPDR: 40.0000 mg | DELAYED_RELEASE_CAPSULE | Freq: Every day | ORAL | Status: DC

## 2012-03-26 NOTE — ED Notes (Signed)
Pt stated that about 1600 she swallowed some water and felt that it went down the wrong way and since then has been experience severe pain upon breathing.

## 2012-03-26 NOTE — ED Provider Notes (Signed)
History     CSN: [id number]  Arrival date & time 03/25/12  2114   First MD Initiated Contact with Patient 03/26/12 0123      Chief Complaint  Patient presents with  . Chest Pain    (Consider location/radiation/quality/duration/timing/severity/associated sxs/prior treatment) Patient is a 52 y.o. female presenting with chest pain. The history is provided by the patient.  Chest Pain Pain location:  Substernal area Pain quality: pressure   Pain radiates to:  Does not radiate Pain radiates to the back: no   Pain severity:  No pain Onset quality:  Gradual Duration:  6 hours Timing:  Constant Progression:  Resolved Chronicity:  New Context: eating   Context comment:  Pt staes swallowed, felt lump.  Staets ate pizza and beer, and went to sleep.  Woke up with substernal burnign to chest.  Resolved now.  Constant pain >than 4 hrs Relieved by:  Nothing Worsened by:  Nothing tried Ineffective treatments:  Aspirin   Past Medical History  Diagnosis Date  . Endometriosis   . Psoriasis   . Calcification of aorta     Abdominal aorta  . CIN I (cervical intraepithelial neoplasia I)   . Hemorrhoid   . Dysplastic colon polyp   . Anxiety   . Seasonal allergies   . Depression   . Arthritis   . Anemia     "borderline"  . Diverticulosis   . Asthma     "bronchial asthmatic, flares up seasonally"    Past Surgical History  Procedure Laterality Date  . Oophorectomy  2002    DX LAP W/RSO  . Oophorectomy  2000    TAH W LSO  . Abdominal hysterectomy  2000    TAH,LSO (RSO IN 2002)  . Appendectomy  1982    LYSIS OF BOWEL ADHESIONS  . Colposcopy    . Gynecologic cryosurgery    . Pelvic laparoscopy      DL  . Tonsillectomy    . Colonoscopy    . Polypectomy    . Colon surgery      "small amount of colon taken at time of oophorectomy in 2002"  . Lipoma excision      removed from back and right foot"done at different times"  . Mass excision  12/24/2011    Procedure: EXCISION  MASS;  Surgeon: Nadara Mustard, MD;  Location: [facility] OR;  Service: Orthopedics;  Laterality: Right;  Right Foot Excision Soft Tissue Mass    Family History  Problem Relation Age of Onset  . Diabetes Mother   . Hypertension Mother   . Lymphoma Father   . Cancer Father   . Diabetes Maternal Grandmother   . Hypertension Maternal Grandmother   . Heart disease Maternal Grandmother     History  Substance Use Topics  . Smoking status: Current Every Day Smoker -- 0.50 packs/day    Types: Cigarettes  . Smokeless tobacco: Never Used  . Alcohol Use: 3.0 oz/week    5 Cans of beer per week     Comment: "social"    OB History   Grav Para Term Preterm Abortions TAB SAB Ect Mult Living   1    1     0      Review of Systems  Cardiovascular: Positive for chest pain.  All other systems reviewed and are negative.    Allergies  Codeine  Home Medications   Current Outpatient Rx  Name  Route  Sig  Dispense  Refill  . ALPRAZolam Prudy Feeler)  0.5 MG tablet   Oral   Take 0.5 mg by mouth 3 (three) times daily as needed. anxiety         . aspirin EC 81 MG tablet   Oral   Take 81 mg by mouth every other day.         . Calcium Carbonate-Vitamin D (CALCIUM + D PO)   Oral   Take 1 tablet by mouth every other day.          Marland Kitchen HYDROcodone-acetaminophen (VICODIN) 5-500 MG per tablet   Oral   Take 1 tablet by mouth every 6 (six) hours as needed for pain.   30 tablet   0     BP 155/104  Pulse 84  Temp(Src) 97.4 F (36.3 C)  Resp 36  SpO2 100%  Physical Exam  Constitutional: She is oriented to person, place, and time. She appears well-developed and well-nourished.  HENT:  Head: Normocephalic and atraumatic.  Eyes: Conjunctivae and EOM are normal. Pupils are equal, round, and reactive to light.  Neck: Normal range of motion.  Cardiovascular: Normal rate, regular rhythm and normal heart sounds.   Pulmonary/Chest: Effort normal and breath sounds normal.  Abdominal: Soft. Bowel sounds  are normal.  Musculoskeletal: Normal range of motion.  Neurological: She is alert and oriented to person, place, and time.  Skin: Skin is warm and dry.  Psychiatric: She has a normal mood and affect. Her behavior is normal.    ED Course  Procedures (including critical care time)  Labs Reviewed  CBC WITH DIFFERENTIAL - Abnormal; Notable for the following:    Lymphs Abs 4.2 (*)    All other components within normal limits  COMPREHENSIVE METABOLIC PANEL - Abnormal; Notable for the following:    Potassium 3.2 (*)    Glucose, Bld 106 (*)    All other components within normal limits  POCT I-STAT TROPONIN I   Dg Chest 2 View  03/25/2012  *RADIOLOGY REPORT*  Clinical Data: Chest pain and shortness of breath.  CHEST - 2 VIEW  Comparison: 12/22/2011  Findings: Two views of the chest demonstrate stable hyperinflation. Lungs are clear without airspace disease or edema. Heart and mediastinum are within normal limits.  The trachea is midline.  IMPRESSION: Stable hyperinflation without focal disease.   Original Report Authenticated By: Richarda Overlie, M.D.      No diagnosis found.  Date: 03/26/2012  Rate: 90  Rhythm: sinus tachycardia  QRS Axis: normal  Intervals: normal  ST/T Wave abnormalities: nonspecific ST changes  Conduction Disutrbances:occasional pvc  Narrative Interpretation:   Old EKG Reviewed: unchanged    MDM  + constant chest pain after eating.  Resolved.  ce neg.  No leg swelling, no tachycardia.;  Declines admission or further workup.  Will dc to oupt fu,  Ret new/worsening sxs        Shaylin Blatt Lytle Michaels, MD 03/26/12 0127

## 2012-10-13 ENCOUNTER — Encounter: Payer: Self-pay | Admitting: Family

## 2012-10-13 ENCOUNTER — Ambulatory Visit (INDEPENDENT_AMBULATORY_CARE_PROVIDER_SITE_OTHER): Payer: Managed Care, Other (non HMO) | Admitting: Family

## 2012-10-13 VITALS — BP 132/98 | HR 92 | Wt 104.0 lb

## 2012-10-13 DIAGNOSIS — F3289 Other specified depressive episodes: Secondary | ICD-10-CM

## 2012-10-13 DIAGNOSIS — F32A Depression, unspecified: Secondary | ICD-10-CM

## 2012-10-13 DIAGNOSIS — R5381 Other malaise: Secondary | ICD-10-CM

## 2012-10-13 DIAGNOSIS — F329 Major depressive disorder, single episode, unspecified: Secondary | ICD-10-CM

## 2012-10-13 DIAGNOSIS — F411 Generalized anxiety disorder: Secondary | ICD-10-CM

## 2012-10-13 LAB — BASIC METABOLIC PANEL
BUN: 11 mg/dL (ref 6–23)
CO2: 29 mEq/L (ref 19–32)
Calcium: 9.5 mg/dL (ref 8.4–10.5)
Chloride: 103 mEq/L (ref 96–112)
Creatinine, Ser: 1 mg/dL (ref 0.4–1.2)
GFR: 59.28 mL/min — ABNORMAL LOW (ref >60.00)
Glucose, Bld: 102 mg/dL — ABNORMAL HIGH (ref 70–99)
Potassium: 4.4 mEq/L (ref 3.5–5.1)
Sodium: 142 mEq/L (ref 135–145)

## 2012-10-13 LAB — CBC WITH DIFFERENTIAL/PLATELET
Basophils Absolute: 0 10*3/uL (ref 0.0–0.1)
Basophils Relative: 0.2 % (ref 0.0–3.0)
Eosinophils Absolute: 0.1 10*3/uL (ref 0.0–0.7)
Eosinophils Relative: 1 % (ref 0.0–5.0)
HCT: 39.2 % (ref 36.0–46.0)
Hemoglobin: 13.5 g/dL (ref 12.0–15.0)
Lymphocytes Relative: 34.9 % (ref 12.0–46.0)
Lymphs Abs: 3.7 10*3/uL (ref 0.7–4.0)
MCHC: 34.5 g/dL (ref 30.0–36.0)
MCV: 89.3 fl (ref 78.0–100.0)
Monocytes Absolute: 0.8 10*3/uL (ref 0.1–1.0)
Monocytes Relative: 7.6 % (ref 3.0–12.0)
Neutro Abs: 6 10*3/uL (ref 1.4–7.7)
Neutrophils Relative %: 56.3 % (ref 43.0–77.0)
Platelets: 274 10*3/uL (ref 150.0–400.0)
RBC: 4.39 Mil/uL (ref 3.87–5.11)
RDW: 14.3 % (ref 11.5–14.6)
WBC: 10.7 10*3/uL — ABNORMAL HIGH (ref 4.5–10.5)

## 2012-10-13 LAB — TSH: TSH: 1.1 u[IU]/mL (ref 0.35–5.50)

## 2012-10-13 MED ORDER — MIRTAZAPINE 15 MG PO TABS: 15.0000 mg | ORAL_TABLET | Freq: Every day | ORAL | Status: DC

## 2012-10-13 MED ORDER — ALPRAZOLAM 0.5 MG PO TABS: 0.5000 mg | ORAL_TABLET | Freq: Three times a day (TID) | ORAL | Status: DC | PRN

## 2012-10-13 NOTE — Progress Notes (Signed)
Subjective:    Patient ID: Susan Aguirre, female    DOB: [birth year], 51 y.o.   MRN: [id number]  HPI 52 year old white female, nonsmoker, is in today  with complaints of weight loss, decreased appetite, and increased stress over the last 6 months. Reports her brother along passing away, has been getting sick, and father-in-law being placed into a nursing home. She reports increased rest at work. Take Xanax 0.5 mg 3 times a day. Reports having difficulty sleeping. He denies any feelings of helplessness, hopelessness, thoughts of death or dying   Review of Systems  Constitutional: Positive for appetite change and unexpected weight change.  Respiratory: Negative.   Cardiovascular: Negative.   Gastrointestinal: Negative.   Endocrine: Negative.   Genitourinary: Negative.   Musculoskeletal: Negative.   Skin: Negative.   Neurological: Negative.   Hematological: Negative.   Psychiatric/Behavioral: The patient is nervous/anxious.    Past Medical History  Diagnosis Date  . Endometriosis   . Psoriasis   . Calcification of aorta     Abdominal aorta  . CIN I (cervical intraepithelial neoplasia I)   . Hemorrhoid   . Dysplastic colon polyp   . Anxiety   . Seasonal allergies   . Depression   . Arthritis   . Anemia     "borderline"  . Diverticulosis   . Asthma     "bronchial asthmatic, flares up seasonally"    History   Social History  . Marital Status: Married    Spouse Name: N/A    Number of Children: N/A  . Years of Education: N/A   Occupational History  . Not on file.   Social History Main Topics  . Smoking status: Current Every Day Smoker -- 0.50 packs/day    Types: Cigarettes  . Smokeless tobacco: Never Used  . Alcohol Use: 3.0 oz/week    5 Cans of beer per week     Comment: "social"  . Drug Use: No  . Sexual Activity: Yes    Birth Control/ Protection: Surgical   Other Topics Concern  . Not on file   Social History Narrative  . No narrative on file     Past Surgical History  Procedure Laterality Date  . Oophorectomy  2002    DX LAP W/RSO  . Oophorectomy  2000    TAH W LSO  . Abdominal hysterectomy  2000    TAH,LSO (RSO IN 2002)  . Appendectomy  1982    LYSIS OF BOWEL ADHESIONS  . Colposcopy    . Gynecologic cryosurgery    . Pelvic laparoscopy      DL  . Tonsillectomy    . Colonoscopy    . Polypectomy    . Colon surgery      "small amount of colon taken at time of oophorectomy in 2002"  . Lipoma excision      removed from back and right foot"done at different times"  . Mass excision  12/24/2011    Procedure: EXCISION MASS;  Surgeon: Nadara Mustard, MD;  Location: [facility] OR;  Service: Orthopedics;  Laterality: Right;  Right Foot Excision Soft Tissue Mass    Family History  Problem Relation Age of Onset  . Diabetes Mother   . Hypertension Mother   . Lymphoma Father   . Cancer Father   . Diabetes Maternal Grandmother   . Hypertension Maternal Grandmother   . Heart disease Maternal Grandmother     Allergies  Allergen Reactions  . Codeine Nausea Only    Current  Outpatient Prescriptions on File Prior to Visit  Medication Sig Dispense Refill  . aspirin EC 81 MG tablet Take 81 mg by mouth every other day.      . Calcium Carbonate-Vitamin D (CALCIUM + D PO) Take 1 tablet by mouth every other day.       . esomeprazole (NEXIUM) 40 MG capsule Take 1 capsule (40 mg total) by mouth daily.  30 capsule  0  . HYDROcodone-acetaminophen (VICODIN) 5-500 MG per tablet Take 1 tablet by mouth every 6 (six) hours as needed for pain.  30 tablet  0   No current facility-administered medications on file prior to visit.    BP 132/98  Pulse 92  Wt 104 lb (47.174 kg)  BMI 16.28 kg/m2  SpO2 98%chart    Objective:   Physical Exam  Constitutional: She is oriented to person, place, and time. She appears well-developed and well-nourished.  HENT:  Right Ear: External ear normal.  Left Ear: External ear normal.  Nose: Nose normal.   Mouth/Throat: Oropharynx is clear and moist.  Neck: Normal range of motion. Neck supple.  Cardiovascular: Normal rate, regular rhythm and normal heart sounds.   Pulmonary/Chest: Effort normal and breath sounds normal.  Abdominal: Soft. Bowel sounds are normal.  Musculoskeletal: Normal range of motion.  Neurological: She is alert and oriented to person, place, and time.  Skin: Skin is warm and dry.  Psychiatric: She has a normal mood and affect.          Assessment & Plan:  Assessment: 1. Anxiety 2. Depression  Plan: Remeron 15mg  at bedtime. Xanax .5mg  three times a day. Call the office with any questions or concerns. Recheck in 3 weeks and sooner as needed.

## 2012-10-13 NOTE — Patient Instructions (Signed)

## 2012-11-17 ENCOUNTER — Ambulatory Visit (INDEPENDENT_AMBULATORY_CARE_PROVIDER_SITE_OTHER): Payer: Managed Care, Other (non HMO) | Admitting: Internal Medicine

## 2012-11-17 ENCOUNTER — Encounter: Payer: Self-pay | Admitting: Internal Medicine

## 2012-11-17 VITALS — BP 122/90 | HR 78 | Temp 97.7°F | Resp 18 | Wt 105.0 lb

## 2012-11-17 DIAGNOSIS — Z23 Encounter for immunization: Secondary | ICD-10-CM

## 2012-11-17 DIAGNOSIS — F439 Reaction to severe stress, unspecified: Secondary | ICD-10-CM

## 2012-11-17 DIAGNOSIS — Z733 Stress, not elsewhere classified: Secondary | ICD-10-CM

## 2012-11-17 MED ORDER — ALPRAZOLAM 0.5 MG PO TABS: 0.5000 mg | ORAL_TABLET | Freq: Three times a day (TID) | ORAL | Status: DC | PRN

## 2012-11-17 MED ORDER — SERTRALINE HCL 25 MG PO TABS: 25.0000 mg | ORAL_TABLET | Freq: Every day | ORAL | Status: DC

## 2012-11-17 NOTE — Patient Instructions (Signed)
It is important that you exercise regularly, at least 20 minutes 3 to 4 times per week.  If you develop chest pain or shortness of breath seek  medical attention.   

## 2012-11-17 NOTE — Progress Notes (Signed)
  Subjective:    Patient ID: Susan Aguirre, female    DOB: 15-Jan-1960, 52 y.o.   MRN: [id number]  HPI Pre-visit discussion using our clinic review tool. No additional management support is needed unless otherwise documented below in the visit note.  52 year old patient who is seen today complaining of increasing stress anxiety and weight loss. She has had several situational stressors including recent death of her brother-in-law and her poor health of her husband. They're both have a difficult time getting through the decease's  Estate. She was seen earlier by another provider and Remeron was tried which she did not tolerate well with considerable early morning sleepiness.  Wt Readings from Last 3 Encounters:  11/17/12 105 lb (47.628 kg)  10/13/12 104 lb (47.174 kg)  12/22/11 112 lb 3.4 oz (50.9 kg)    Review of Systems  Constitutional: Positive for unexpected weight change.  Psychiatric/Behavioral: Positive for sleep disturbance and dysphoric mood. The patient is nervous/anxious.        Objective:   Physical Exam  Constitutional: She appears well-developed and well-nourished. No distress.  Psychiatric:  Anxious with dysphoric mood Accompanied by husband          Assessment & Plan:   Situational stress/anxiety. We'll try sertraline 25 mg in the morning. We'll continue to alprazolam. We'll recheck in 6 weeks Behavioral health referral discussed we'll reassess in 6 weeks

## 2012-11-26 ENCOUNTER — Telehealth: Payer: Self-pay | Admitting: Internal Medicine

## 2012-11-26 NOTE — Telephone Encounter (Signed)
Pt would like to verify her and her husband wellness forms were faxed given to you on 11/17/13?

## 2012-11-26 NOTE — Telephone Encounter (Signed)
Spoke to pt told her forms were faxed for her and her husband when they were here for there visit 11/12. Pt verbalized understanding.

## 2012-12-21 ENCOUNTER — Other Ambulatory Visit: Payer: Self-pay | Admitting: Dermatology

## 2013-02-17 ENCOUNTER — Encounter: Payer: Self-pay | Admitting: Family Medicine

## 2013-02-17 ENCOUNTER — Ambulatory Visit (INDEPENDENT_AMBULATORY_CARE_PROVIDER_SITE_OTHER): Payer: Managed Care, Other (non HMO) | Admitting: Family Medicine

## 2013-02-17 VITALS — BP 122/80 | Temp 98.1°F | Wt 103.0 lb

## 2013-02-17 DIAGNOSIS — R634 Abnormal weight loss: Secondary | ICD-10-CM

## 2013-02-17 DIAGNOSIS — F4323 Adjustment disorder with mixed anxiety and depressed mood: Secondary | ICD-10-CM

## 2013-02-17 DIAGNOSIS — F410 Panic disorder [episodic paroxysmal anxiety] without agoraphobia: Secondary | ICD-10-CM

## 2013-02-17 NOTE — Progress Notes (Signed)
Chief Complaint  Patient presents with  . Stress    HPI:  Acute visit for anxiety and stress: -saw padonda campbell and trial of remeron not tolerated due to lethargy -then saw pcp with trial zoloft and xanax and recs to follow up with PCP in 6 weeks and behavorial health referral if needed -reports: tried zoloft for 5 days but caused nausea and stopped it; denies SI, worries all the time, depressed mood, has been through a lot with family health issues -husband hospitalized for stress, orthopedic issues, reports needed day off today and yesterday to deal with husband's issues,  panic attacks a few times per week, also no appetite since all of this started and has lost weight - she is leary of medications but feels does better on xanax tid -reports labs done in oct and ok -husband reports she always works herself up to the point she can't eat -denies: SI  ROS: See pertinent positives and negatives per HPI.  Past Medical History  Diagnosis Date  . Endometriosis   . Psoriasis   . Calcification of aorta     Abdominal aorta  . CIN I (cervical intraepithelial neoplasia I)   . Hemorrhoid   . Dysplastic colon polyp   . Anxiety   . Seasonal allergies   . Depression   . Arthritis   . Anemia     "borderline"  . Diverticulosis   . Asthma     "bronchial asthmatic, flares up seasonally"    Past Surgical History  Procedure Laterality Date  . Oophorectomy  2002    DX LAP W/RSO  . Oophorectomy  2000    TAH W LSO  . Abdominal hysterectomy  2000    TAH,LSO (RSO IN 2002)  . Appendectomy  1982    LYSIS OF BOWEL ADHESIONS  . Colposcopy    . Gynecologic cryosurgery    . Pelvic laparoscopy      DL  . Tonsillectomy    . Colonoscopy    . Polypectomy    . Colon surgery      "small amount of colon taken at time of oophorectomy in 2002"  . Lipoma excision      removed from back and right foot"done at different times"  . Mass excision  12/24/2011    Procedure: EXCISION MASS;  Surgeon:  Newt Minion, MD;  Location: [facility];  Service: Orthopedics;  Laterality: Right;  Right Foot Excision Soft Tissue Mass    Family History  Problem Relation Age of Onset  . Diabetes Mother   . Hypertension Mother   . Lymphoma Father   . Cancer Father   . Diabetes Maternal Grandmother   . Hypertension Maternal Grandmother   . Heart disease Maternal Grandmother     History   Social History  . Marital Status: Married    Spouse Name: N/A    Number of Children: N/A  . Years of Education: N/A   Social History Main Topics  . Smoking status: Current Every Day Smoker -- 0.50 packs/day    Types: Cigarettes  . Smokeless tobacco: Never Used  . Alcohol Use: 3.0 oz/week    5 Cans of beer per week     Comment: "social"  . Drug Use: No  . Sexual Activity: Yes    Birth Control/ Protection: Surgical   Other Topics Concern  . None   Social History Narrative  . None    Current outpatient prescriptions:ALPRAZolam (XANAX) 0.5 MG tablet, Take 1 tablet (0.5 mg total)  by mouth 3 (three) times daily as needed. anxiety, Disp: 90 tablet, Rfl: 2;  aspirin EC 81 MG tablet, Take 81 mg by mouth every other day., Disp: , Rfl: ;  Calcium Carbonate-Vitamin D (CALCIUM + D PO), Take 1 tablet by mouth every other day. , Disp: , Rfl: ;  omeprazole (PRILOSEC OTC) 20 MG tablet, Take 20 mg by mouth daily., Disp: , Rfl:  sertraline (ZOLOFT) 25 MG tablet, Take 1 tablet (25 mg total) by mouth daily., Disp: 90 tablet, Rfl: 2  EXAM:  Filed Vitals:   02/17/13 1040  BP: 122/80  Temp: 98.1 F (36.7 C)    Body mass index is 16.13 kg/(m^2).  GENERAL: vitals reviewed and listed above, alert, oriented, appears well hydrated and in no acute distress, thin  HEENT: atraumatic, conjunttiva clear, no obvious abnormalities on inspection of external nose and ears  NECK: no obvious masses on inspection  LUNGS: clear to auscultation bilaterally, no wheezes, rales or rhonchi, good air movement  CV: HRRR, no peripheral  edema  MS: moves all extremities without noticeable abnormality  PSYCH: pleasant and cooperative, anxious and depressed and tearful when discussing husband's health  ASSESSMENT AND PLAN:  Discussed the following assessment and plan:  Adjustment reaction with anxiety and depression  Loss of weight  Panic disorder  -we discussed possible serious and likely etiologies, workup and treatment, treatment risks and return precautions -her anxiety, panic and depression are severe and likely are contributing to appetite issues and offered behavioral health referral (suggested in PCP notes)- she does not wish to do this at this time - instead we opted to do her benzo tid as prescibed on bad days as she also does not wish to try other medications or counseling -we discussed that while anxiety could be cause of weight loss discussed other etiologies  and advised needs work up for this as well - offered labs today but she opted to wait until sees PCP for this -follow up advised in the next month with PCP -of course, we advised Kellyjo  to return or notify a doctor immediately if symptoms worsen or persist or new concerns arise.   There are no Patient Instructions on file for this visit.   Colin Benton R.

## 2013-02-17 NOTE — Progress Notes (Signed)
Pre visit review using our clinic review tool, if applicable. No additional management support is needed unless otherwise documented below in the visit note. 

## 2013-02-18 ENCOUNTER — Telehealth: Payer: Self-pay | Admitting: Internal Medicine

## 2013-02-18 NOTE — Telephone Encounter (Signed)
Relevant patient education mailed to patient.  

## 2013-02-21 ENCOUNTER — Ambulatory Visit (INDEPENDENT_AMBULATORY_CARE_PROVIDER_SITE_OTHER): Payer: Managed Care, Other (non HMO) | Admitting: Internal Medicine

## 2013-02-21 ENCOUNTER — Encounter: Payer: Self-pay | Admitting: Internal Medicine

## 2013-02-21 VITALS — BP 144/90 | HR 73 | Temp 97.8°F | Resp 18 | Ht 67.0 in | Wt 102.0 lb

## 2013-02-21 DIAGNOSIS — F4323 Adjustment disorder with mixed anxiety and depressed mood: Secondary | ICD-10-CM

## 2013-02-21 MED ORDER — BUSPIRONE HCL 5 MG PO TABS: 5.0000 mg | ORAL_TABLET | Freq: Three times a day (TID) | ORAL | Status: DC

## 2013-02-21 NOTE — Progress Notes (Signed)
Subjective:    Patient ID: Susan Aguirre, female    DOB: 1960/02/27, 53 y.o.   MRN: [id number]  HPI 53 year old patient who is seen today in followup.  She continues to feel poorly with significant anxiety.  She has been out of work since November 11 due to the extreme stress weakness and poor appetite.  She continues to lose weight.  She has not tolerated Remeron or sertraline and presently is on a 3 times a day dose of alprazolam.  She is requesting a leave of absence from work.  Past Medical History  Diagnosis Date  . Endometriosis   . Psoriasis   . Calcification of aorta     Abdominal aorta  . CIN I (cervical intraepithelial neoplasia I)   . Hemorrhoid   . Dysplastic colon polyp   . Anxiety   . Seasonal allergies   . Depression   . Arthritis   . Anemia     "borderline"  . Diverticulosis   . Asthma     "bronchial asthmatic, flares up seasonally"    History   Social History  . Marital Status: Married    Spouse Name: N/A    Number of Children: N/A  . Years of Education: N/A   Occupational History  . Not on file.   Social History Main Topics  . Smoking status: Current Every Day Smoker -- 0.50 packs/day    Types: Cigarettes  . Smokeless tobacco: Never Used  . Alcohol Use: 3.0 oz/week    5 Cans of beer per week     Comment: "social"  . Drug Use: No  . Sexual Activity: Yes    Birth Control/ Protection: Surgical   Other Topics Concern  . Not on file   Social History Narrative  . No narrative on file    Past Surgical History  Procedure Laterality Date  . Oophorectomy  2002    DX LAP W/RSO  . Oophorectomy  2000    TAH W LSO  . Abdominal hysterectomy  2000    TAH,LSO (RSO IN 2002)  . Appendectomy  1982    LYSIS OF BOWEL ADHESIONS  . Colposcopy    . Gynecologic cryosurgery    . Pelvic laparoscopy      DL  . Tonsillectomy    . Colonoscopy    . Polypectomy    . Colon surgery      "small amount of colon taken at time of oophorectomy in 2002"    . Lipoma excision      removed from back and right foot"done at different times"  . Mass excision  12/24/2011    Procedure: EXCISION MASS;  Surgeon: Newt Minion, MD;  Location: [facility];  Service: Orthopedics;  Laterality: Right;  Right Foot Excision Soft Tissue Mass    Family History  Problem Relation Age of Onset  . Diabetes Mother   . Hypertension Mother   . Lymphoma Father   . Cancer Father   . Diabetes Maternal Grandmother   . Hypertension Maternal Grandmother   . Heart disease Maternal Grandmother     Allergies  Allergen Reactions  . Codeine Nausea Only    Current Outpatient Prescriptions on File Prior to Visit  Medication Sig Dispense Refill  . ALPRAZolam (XANAX) 0.5 MG tablet Take 1 tablet (0.5 mg total) by mouth 3 (three) times daily as needed. anxiety  90 tablet  2  . aspirin EC 81 MG tablet Take 81 mg by mouth every other day.      Marland Kitchen  Calcium Carbonate-Vitamin D (CALCIUM + D PO) Take 1 tablet by mouth every other day.       Marland Kitchen omeprazole (PRILOSEC OTC) 20 MG tablet Take 20 mg by mouth as needed.       . sertraline (ZOLOFT) 25 MG tablet Take 1 tablet (25 mg total) by mouth daily.  90 tablet  2   No current facility-administered medications on file prior to visit.    BP 144/90  Pulse 73  Temp(Src) 97.8 F (36.6 C) (Oral)  Resp 18  Ht 5\' 7"  (1.702 m)  Wt 102 lb (46.267 kg)  BMI 15.97 kg/m2  SpO2 99%      Review of Systems  Constitutional: Negative.   HENT: Negative for congestion, dental problem, hearing loss, rhinorrhea, sinus pressure, sore throat and tinnitus.   Eyes: Negative for pain, discharge and visual disturbance.  Respiratory: Negative for cough and shortness of breath.   Cardiovascular: Negative for chest pain, palpitations and leg swelling.  Gastrointestinal: Negative for nausea, vomiting, abdominal pain, diarrhea, constipation, blood in stool and abdominal distention.  Genitourinary: Negative for dysuria, urgency, frequency, hematuria, flank  pain, vaginal bleeding, vaginal discharge, difficulty urinating, vaginal pain and pelvic pain.  Musculoskeletal: Negative for arthralgias, gait problem and joint swelling.  Skin: Negative for rash.  Neurological: Negative for dizziness, syncope, speech difficulty, weakness, numbness and headaches.  Hematological: Negative for adenopathy.  Psychiatric/Behavioral: Positive for behavioral problems, dysphoric mood and decreased concentration. Negative for agitation. The patient is nervous/anxious.        Objective:   Physical Exam  Constitutional: She is oriented to person, place, and time. She appears well-developed and well-nourished.  Blood pressure 140/90  HENT:  Head: Normocephalic.  Right Ear: External ear normal.  Left Ear: External ear normal.  Mouth/Throat: Oropharynx is clear and moist.  Eyes: Conjunctivae and EOM are normal. Pupils are equal, round, and reactive to light.  Neck: Normal range of motion. Neck supple. No thyromegaly present.  Cardiovascular: Normal rate, regular rhythm, normal heart sounds and intact distal pulses.   Pulmonary/Chest: Effort normal and breath sounds normal.  Abdominal: Soft. Bowel sounds are normal. She exhibits no mass. There is no tenderness.  Musculoskeletal: Normal range of motion.  Lymphadenopathy:    She has no cervical adenopathy.  Neurological: She is alert and oriented to person, place, and time.  Skin: Skin is warm and dry. No rash noted.  Psychiatric: She has a normal mood and affect. Her behavior is normal.  Anxious with depressed affect          Assessment & Plan:  Adjustment disorder with anxiety and depressed mood.  The patient was given contact information for behavioral health referral.  We'll continue Xanax Medical leave of absence from work for 30 days Add BuSpar twice a day

## 2013-02-21 NOTE — Patient Instructions (Signed)
Behavioral health referral as discussed  Return in one month for followup  Leave of absence from work as discussed

## 2013-02-21 NOTE — Progress Notes (Signed)
Pre-visit discussion using our clinic review tool. No additional management support is needed unless otherwise documented below in the visit note.  

## 2013-02-23 ENCOUNTER — Encounter: Payer: Self-pay | Admitting: Internal Medicine

## 2013-02-23 ENCOUNTER — Telehealth: Payer: Self-pay | Admitting: Internal Medicine

## 2013-02-23 NOTE — Telephone Encounter (Signed)
[employer] sent paper work on pt's short term disabilty faxed yesterday.  they  hoping to get paperwork back by 2/24.  If you did not receive, pls let carrie know.

## 2013-02-23 NOTE — Telephone Encounter (Signed)
Relevant patient education mailed to patient.  

## 2013-02-24 ENCOUNTER — Ambulatory Visit: Payer: Managed Care, Other (non HMO) | Admitting: Internal Medicine

## 2013-02-24 NOTE — Telephone Encounter (Signed)
Received paperwork, given to Dr. Raliegh Ip to fill out.

## 2013-03-01 ENCOUNTER — Telehealth: Payer: Self-pay | Admitting: Internal Medicine

## 2013-03-01 NOTE — Telephone Encounter (Signed)
Spoke to pt told her I do not have copy of form filled out in chart yet. Pt said she has additional forms to be filled out and will try to drop off tomorrow. Told her okay just ask them to give the forms to me and I will make sure Dr Raliegh Ip fills them out before he goes away. Pt verbalized understanding.

## 2013-03-01 NOTE — Telephone Encounter (Signed)
Pt is requesting to speak with a nurse regarding your current disability leave, states she received a call from the disability department stating that they do have enough information to get it approved.

## 2013-03-07 ENCOUNTER — Telehealth: Payer: Self-pay | Admitting: *Deleted

## 2013-03-07 NOTE — Telephone Encounter (Signed)
Spoke to pt told her FMLA forms are filled out and ready. Pt asked if I could fax them please. Told pt okay will fax and mail original to her. Pt verbalized understanding. Faxed to [employer] at [phone] and original mailed to pt.

## 2013-03-09 ENCOUNTER — Other Ambulatory Visit: Payer: Self-pay | Admitting: Internal Medicine

## 2013-03-11 NOTE — Telephone Encounter (Signed)
Pt want to make sure effective date that she was out of work is 02/16/13. Pt states the copy she received in the mail states 02/21/13, that is how she became aware.  Pt may stop by Monday 3/9 to have this corrected and refaxed.

## 2013-03-14 ENCOUNTER — Encounter (HOSPITAL_COMMUNITY): Payer: Self-pay | Admitting: Psychiatry

## 2013-03-14 ENCOUNTER — Ambulatory Visit (INDEPENDENT_AMBULATORY_CARE_PROVIDER_SITE_OTHER): Payer: Managed Care, Other (non HMO) | Admitting: Psychiatry

## 2013-03-14 VITALS — BP 147/89 | HR 89 | Ht 67.0 in | Wt 100.8 lb

## 2013-03-14 DIAGNOSIS — F329 Major depressive disorder, single episode, unspecified: Secondary | ICD-10-CM

## 2013-03-14 DIAGNOSIS — F3289 Other specified depressive episodes: Secondary | ICD-10-CM

## 2013-03-14 MED ORDER — ESCITALOPRAM OXALATE 10 MG PO TABS: ORAL_TABLET | ORAL | Status: DC
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
## 2013-03-15 NOTE — Telephone Encounter (Signed)
Spoke to pt told her I will change the dates for leave to 02/16/2013 through 03/17/2013 and refax to [place]. Pt verbalized understanding. FMLA updated forms refaxed to [employer].

## 2013-03-16 ENCOUNTER — Telehealth (HOSPITAL_COMMUNITY): Payer: Self-pay | Admitting: *Deleted

## 2013-03-16 NOTE — Telephone Encounter (Signed)
Patient left Susan Aguirre day is she supposed to return to work, 3/30 or 3/31? 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 Contacted [employer], left VM for Case manager:Informed Ms. Malen Gauze that paperwork will be completed as time allows, will make effort to retrun by close of buisiness 3/12..MD has stated pt return to work date is 3/30. Contacted patient:Informed patient RTW date is 3/30, next appt with MD is 3/27.Advised her that CM with [employer] disability had called for same information.

## 2013-03-24 ENCOUNTER — Encounter: Payer: Self-pay | Admitting: Internal Medicine

## 2013-03-24 ENCOUNTER — Ambulatory Visit (INDEPENDENT_AMBULATORY_CARE_PROVIDER_SITE_OTHER): Payer: Managed Care, Other (non HMO) | Admitting: Internal Medicine

## 2013-03-24 VITALS — BP 130/80 | HR 73 | Temp 98.0°F | Resp 18 | Ht 67.0 in | Wt 103.0 lb

## 2013-03-24 DIAGNOSIS — F4323 Adjustment disorder with mixed anxiety and depressed mood: Secondary | ICD-10-CM

## 2013-03-24 NOTE — Progress Notes (Signed)
Pre-visit discussion using our clinic review tool. No additional management support is needed unless otherwise documented below in the visit note.  

## 2013-03-24 NOTE — Patient Instructions (Signed)
Call or return to clinic prn if these symptoms worsen or fail to improve as anticipated.

## 2013-03-24 NOTE — Progress Notes (Signed)
   Subjective:    Patient ID: Susan Aguirre, female    DOB: Nov 24, 1960, 53 y.o.   MRN: [id number]  HPI  53 year old patient who is seen today for followup.  She has a history of situational stress with depressed mood.  She is now being followed by psychiatry.  She remains on a medical leave of absence but is scheduled to return to work on March 30.  She does have psychiatric followup on the 27th.  She is also scheduled to see a therapist later on next month  Wt Readings from Last 3 Encounters:  03/24/13 103 lb (46.72 kg)  03/14/13 100 lb 12.8 oz (45.723 kg)  02/21/13 102 lb (46.267 kg)     Review of Systems  Constitutional: Negative.   HENT: Negative for congestion, dental problem, hearing loss, rhinorrhea, sinus pressure, sore throat and tinnitus.   Eyes: Negative for pain, discharge and visual disturbance.  Respiratory: Negative for cough and shortness of breath.   Cardiovascular: Negative for chest pain, palpitations and leg swelling.  Gastrointestinal: Negative for nausea, vomiting, abdominal pain, diarrhea, constipation, blood in stool and abdominal distention.  Genitourinary: Negative for dysuria, urgency, frequency, hematuria, flank pain, vaginal bleeding, vaginal discharge, difficulty urinating, vaginal pain and pelvic pain.  Musculoskeletal: Negative for arthralgias, gait problem and joint swelling.  Skin: Negative for rash.  Neurological: Negative for dizziness, syncope, speech difficulty, weakness, numbness and headaches.  Hematological: Negative for adenopathy.  Psychiatric/Behavioral: Positive for behavioral problems, dysphoric mood and decreased concentration. Negative for agitation. The patient is nervous/anxious.        Objective:   Physical Exam  Constitutional: She appears well-developed and well-nourished. No distress.  Psychiatric: Her behavior is normal. Judgment and thought content normal.  Depressed affect          Assessment & Plan:    Adjustment disorder with mixed anxiety and depression Psychiatric followup Follow behavioral health specialist  Return here when necessary

## 2013-03-25 ENCOUNTER — Telehealth: Payer: Self-pay | Admitting: Internal Medicine

## 2013-03-25 NOTE — Telephone Encounter (Signed)
Relevant patient education mailed to patient.  

## 2013-03-30 ENCOUNTER — Telehealth (HOSPITAL_COMMUNITY): Payer: Self-pay | Admitting: *Deleted

## 2013-03-30 NOTE — Telephone Encounter (Signed)
Case manager with [employer] left BM:[id number] update.On Short Term Disability until 04/04/13.Any changes? Sent fax to office. Note:Patient has Release of Information on file.Contacted CM and left message:  No changes to reprot at this time.Informed CM that patient has appt with MD on 3/27 for assessment.Final determination of return to work date will be made then.

## 2013-04-01 ENCOUNTER — Ambulatory Visit (INDEPENDENT_AMBULATORY_CARE_PROVIDER_SITE_OTHER): Payer: Managed Care, Other (non HMO) | Admitting: Psychiatry

## 2013-04-01 ENCOUNTER — Encounter (HOSPITAL_COMMUNITY): Payer: Self-pay | Admitting: Psychiatry

## 2013-04-01 VITALS — BP 121/86 | HR 68 | Ht 65.5 in | Wt 99.8 lb

## 2013-04-01 DIAGNOSIS — F329 Major depressive disorder, single episode, unspecified: Secondary | ICD-10-CM

## 2013-04-01 DIAGNOSIS — F3289 Other specified depressive episodes: Secondary | ICD-10-CM

## 2013-04-01 MED ORDER — ESCITALOPRAM OXALATE 10 MG PO TABS: 10.0000 mg | ORAL_TABLET | Freq: Every day | ORAL | Status: DC
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
## 2013-04-28 ENCOUNTER — Ambulatory Visit (HOSPITAL_COMMUNITY): Payer: Self-pay | Admitting: Psychiatry

## 2013-05-04 ENCOUNTER — Ambulatory Visit (INDEPENDENT_AMBULATORY_CARE_PROVIDER_SITE_OTHER): Payer: Managed Care, Other (non HMO) | Admitting: Psychiatry

## 2013-05-04 DIAGNOSIS — F3289 Other specified depressive episodes: Secondary | ICD-10-CM

## 2013-05-04 DIAGNOSIS — F329 Major depressive disorder, single episode, unspecified: Secondary | ICD-10-CM
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
## 2013-05-11 ENCOUNTER — Encounter (HOSPITAL_COMMUNITY): Payer: Self-pay | Admitting: Psychiatry

## 2013-05-11 ENCOUNTER — Ambulatory Visit (INDEPENDENT_AMBULATORY_CARE_PROVIDER_SITE_OTHER): Payer: Managed Care, Other (non HMO) | Admitting: Psychiatry

## 2013-05-11 VITALS — BP 120/87 | HR 67 | Ht 66.0 in | Wt 102.2 lb

## 2013-05-11 DIAGNOSIS — F329 Major depressive disorder, single episode, unspecified: Secondary | ICD-10-CM

## 2013-05-11 DIAGNOSIS — F3289 Other specified depressive episodes: Secondary | ICD-10-CM

## 2013-05-11 MED ORDER — ESCITALOPRAM OXALATE 10 MG PO TABS: 10.0000 mg | ORAL_TABLET | Freq: Every day | ORAL | Status: DC
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
## 2013-06-26 ENCOUNTER — Other Ambulatory Visit: Payer: Self-pay | Admitting: Internal Medicine

## 2013-07-13 ENCOUNTER — Ambulatory Visit (INDEPENDENT_AMBULATORY_CARE_PROVIDER_SITE_OTHER): Payer: Managed Care, Other (non HMO) | Admitting: Psychiatry

## 2013-07-13 ENCOUNTER — Encounter (HOSPITAL_COMMUNITY): Payer: Self-pay | Admitting: Psychiatry

## 2013-07-13 VITALS — BP 123/73 | HR 62 | Ht 66.0 in

## 2013-07-13 DIAGNOSIS — F3289 Other specified depressive episodes: Secondary | ICD-10-CM

## 2013-07-13 DIAGNOSIS — F329 Major depressive disorder, single episode, unspecified: Secondary | ICD-10-CM

## 2013-07-13 MED ORDER — ESCITALOPRAM OXALATE 10 MG PO TABS: 10.0000 mg | ORAL_TABLET | Freq: Every day | ORAL | Status: DC
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
## 2013-08-02 ENCOUNTER — Other Ambulatory Visit: Payer: Self-pay | Admitting: Internal Medicine

## 2013-08-03 ENCOUNTER — Ambulatory Visit (HOSPITAL_COMMUNITY): Payer: Self-pay | Admitting: Psychiatry

## 2013-08-28 ENCOUNTER — Other Ambulatory Visit (HOSPITAL_COMMUNITY): Payer: Self-pay | Admitting: Psychiatry

## 2013-08-30 ENCOUNTER — Other Ambulatory Visit (HOSPITAL_COMMUNITY): Payer: Self-pay | Admitting: Psychiatry

## 2013-08-30 NOTE — Telephone Encounter (Signed)
Given on 07/13/13 with 2 refills.

## 2013-08-30 NOTE — Telephone Encounter (Signed)
She has given print hard copy. Please contact patient. Thanks

## 2013-10-07 ENCOUNTER — Telehealth (HOSPITAL_COMMUNITY): Payer: Self-pay | Admitting: *Deleted

## 2013-10-07 NOTE — Telephone Encounter (Signed)
Pharmacy request for refill of Lexapro for a 90 day supply.

## 2013-10-10 ENCOUNTER — Other Ambulatory Visit (HOSPITAL_COMMUNITY): Payer: Self-pay | Admitting: Psychiatry

## 2013-10-10 ENCOUNTER — Telehealth (HOSPITAL_COMMUNITY): Payer: Self-pay

## 2013-10-10 DIAGNOSIS — F329 Major depressive disorder, single episode, unspecified: Secondary | ICD-10-CM

## 2013-10-10 DIAGNOSIS — F32A Depression, unspecified: Secondary | ICD-10-CM

## 2013-10-10 MED ORDER — ESCITALOPRAM OXALATE 10 MG PO TABS: 10.0000 mg | ORAL_TABLET | Freq: Every day | ORAL | Status: DC

## 2013-10-11 ENCOUNTER — Other Ambulatory Visit (HOSPITAL_COMMUNITY): Payer: Self-pay | Admitting: Psychiatry

## 2013-11-07 ENCOUNTER — Encounter (HOSPITAL_COMMUNITY): Payer: Self-pay | Admitting: Psychiatry

## 2013-11-13 ENCOUNTER — Other Ambulatory Visit: Payer: Self-pay | Admitting: Internal Medicine

## 2013-11-16 ENCOUNTER — Encounter (HOSPITAL_COMMUNITY): Payer: Self-pay | Admitting: Psychiatry

## 2013-11-16 ENCOUNTER — Ambulatory Visit (INDEPENDENT_AMBULATORY_CARE_PROVIDER_SITE_OTHER): Payer: Managed Care, Other (non HMO) | Admitting: Psychiatry

## 2013-11-16 VITALS — BP 126/93 | HR 64 | Ht 66.0 in | Wt 103.0 lb

## 2013-11-16 DIAGNOSIS — F329 Major depressive disorder, single episode, unspecified: Secondary | ICD-10-CM

## 2013-11-16 DIAGNOSIS — F32A Depression, unspecified: Secondary | ICD-10-CM
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
## 2014-01-03 ENCOUNTER — Other Ambulatory Visit (HOSPITAL_COMMUNITY): Payer: Self-pay | Admitting: Psychiatry

## 2014-01-07 ENCOUNTER — Other Ambulatory Visit (HOSPITAL_COMMUNITY): Payer: Self-pay | Admitting: Psychiatry

## 2014-01-12 ENCOUNTER — Other Ambulatory Visit (HOSPITAL_COMMUNITY): Payer: Self-pay | Admitting: Psychiatry

## 2014-01-12 NOTE — Telephone Encounter (Signed)
Given on 01/04/14 for 90 days.

## 2014-02-14 ENCOUNTER — Other Ambulatory Visit: Payer: Self-pay | Admitting: Internal Medicine

## 2014-03-09 ENCOUNTER — Other Ambulatory Visit: Payer: Self-pay | Admitting: Sports Medicine

## 2014-03-09 DIAGNOSIS — M545 Low back pain: Secondary | ICD-10-CM

## 2014-03-17 ENCOUNTER — Other Ambulatory Visit: Payer: Self-pay | Admitting: Internal Medicine

## 2014-03-26 ENCOUNTER — Other Ambulatory Visit: Payer: Self-pay

## 2014-04-20 ENCOUNTER — Other Ambulatory Visit: Payer: Self-pay | Admitting: Internal Medicine

## 2014-04-21 NOTE — Telephone Encounter (Signed)
Patient is scheduled for 05/19/14.

## 2014-04-21 NOTE — Telephone Encounter (Signed)
Please call pt and schedule Physical for May, pt is overdue. Once she is scheduled I will call in Rx for Alprazolam. Thanks

## 2014-05-17 ENCOUNTER — Ambulatory Visit (HOSPITAL_COMMUNITY): Payer: Self-pay | Admitting: Psychiatry

## 2014-05-19 ENCOUNTER — Encounter: Payer: Self-pay | Admitting: Internal Medicine

## 2014-05-29 ENCOUNTER — Ambulatory Visit (INDEPENDENT_AMBULATORY_CARE_PROVIDER_SITE_OTHER): Payer: Managed Care, Other (non HMO) | Admitting: Internal Medicine

## 2014-05-29 ENCOUNTER — Encounter: Payer: Self-pay | Admitting: Internal Medicine

## 2014-05-29 ENCOUNTER — Encounter: Payer: Self-pay | Admitting: *Deleted

## 2014-05-29 VITALS — BP 126/90 | HR 58 | Temp 98.0°F | Resp 18 | Ht 65.5 in | Wt 103.0 lb

## 2014-05-29 DIAGNOSIS — F4323 Adjustment disorder with mixed anxiety and depressed mood: Secondary | ICD-10-CM | POA: Diagnosis not present

## 2014-05-29 DIAGNOSIS — Z0000 Encounter for general adult medical examination without abnormal findings: Secondary | ICD-10-CM | POA: Diagnosis not present

## 2014-05-29 MED ORDER — ALPRAZOLAM 0.5 MG PO TABS: ORAL_TABLET | ORAL | Status: DC

## 2014-05-29 NOTE — Progress Notes (Signed)
Pre visit review using our clinic review tool, if applicable. No additional management support is needed unless otherwise documented below in the visit note. 

## 2014-05-29 NOTE — Patient Instructions (Signed)

## 2014-05-29 NOTE — Progress Notes (Signed)
Subjective:    Patient ID: Susan Aguirre, female    DOB: 02/13/1960, 54 y.o.   MRN: [id number]  HPI 54 year old patient who is seen today for a preventive health examination She is followed by psychiatry due to adjustment disorder with anxiety and depressed mood.  She remains on Lexapro and when necessary alprazolam.  She still complains of situational stressors and anxiety. She has a history of endometriosis and is status post staged total hysterectomy in 2000 and 2002  Past Medical History  Diagnosis Date  . Endometriosis   . Psoriasis   . Calcification of aorta     Abdominal aorta  . CIN I (cervical intraepithelial neoplasia I)   . Hemorrhoid   . Dysplastic colon polyp   . Anxiety   . Seasonal allergies   . Depression   . Arthritis   . Anemia     "borderline"  . Diverticulosis   . Asthma     "bronchial asthmatic, flares up seasonally"    History   Social History  . Marital Status: Married    Spouse Name: N/A  . Number of Children: N/A  . Years of Education: N/A   Occupational History  . Not on file.   Social History Main Topics  . Smoking status: Current Every Day Smoker -- 0.50 packs/day    Types: Cigarettes  . Smokeless tobacco: Never Used  . Alcohol Use: 3.0 oz/week    5 Cans of beer per week     Comment: "social"  . Drug Use: No  . Sexual Activity: Yes    Birth Control/ Protection: Surgical   Other Topics Concern  . Not on file   Social History Narrative    Past Surgical History  Procedure Laterality Date  . Oophorectomy  2002    DX LAP W/RSO  . Oophorectomy  2000    TAH W LSO  . Abdominal hysterectomy  2000    TAH,LSO (RSO IN 2002)  . Appendectomy  1982    LYSIS OF BOWEL ADHESIONS  . Colposcopy    . Gynecologic cryosurgery    . Pelvic laparoscopy      DL  . Tonsillectomy    . Colonoscopy    . Polypectomy    . Colon surgery      "small amount of colon taken at time of oophorectomy in 2002"  . Lipoma excision      removed  from back and right foot"done at different times"  . Mass excision  12/24/2011    Procedure: EXCISION MASS;  Surgeon: Newt Minion, MD;  Location: [facility];  Service: Orthopedics;  Laterality: Right;  Right Foot Excision Soft Tissue Mass    Family History  Problem Relation Age of Onset  . Diabetes Mother   . Hypertension Mother   . Lymphoma Father   . Cancer Father   . Diabetes Maternal Grandmother   . Hypertension Maternal Grandmother   . Heart disease Maternal Grandmother     Allergies  Allergen Reactions  . Codeine Nausea Only    Current Outpatient Prescriptions on File Prior to Visit  Medication Sig Dispense Refill  . aspirin EC 81 MG tablet Take 81 mg by mouth every other day.    . Calcium Carbonate-Vitamin D (CALCIUM + D PO) Take 1 tablet by mouth every other day.     . escitalopram (LEXAPRO) 10 MG tablet TAKE 1 TABLET (10 MG TOTAL) BY MOUTH AT BEDTIME. (Patient taking differently: TAKE 1 TABLET (5 MG TOTAL) BY  MOUTH AT BEDTIME.) 90 tablet 0  . omeprazole (PRILOSEC OTC) 20 MG tablet Take 20 mg by mouth as needed.      No current facility-administered medications on file prior to visit.    BP 126/90 mmHg  Pulse 58  Temp(Src) 98 F (36.7 C) (Oral)  Resp 18  Ht 5' 5.5" (1.664 m)  Wt 103 lb (46.72 kg)  BMI 16.87 kg/m2  SpO2 96%      Review of Systems  Constitutional: Negative.   HENT: Negative for congestion, dental problem, hearing loss, rhinorrhea, sinus pressure, sore throat and tinnitus.   Eyes: Negative for pain, discharge and visual disturbance.  Respiratory: Negative for cough and shortness of breath.   Cardiovascular: Negative for chest pain, palpitations and leg swelling.  Gastrointestinal: Negative for nausea, vomiting, abdominal pain, diarrhea, constipation, blood in stool and abdominal distention.  Genitourinary: Negative for dysuria, urgency, frequency, hematuria, flank pain, vaginal bleeding, vaginal discharge, difficulty urinating, vaginal pain and  pelvic pain.  Musculoskeletal: Negative for joint swelling, arthralgias and gait problem.  Skin: Negative for rash.  Neurological: Negative for dizziness, syncope, speech difficulty, weakness, numbness and headaches.  Hematological: Negative for adenopathy.  Psychiatric/Behavioral: Negative for behavioral problems, dysphoric mood and agitation. The patient is nervous/anxious.        Objective:   Physical Exam  Constitutional: She is oriented to person, place, and time. She appears well-developed and well-nourished.  HENT:  Head: Normocephalic.  Right Ear: External ear normal.  Left Ear: External ear normal.  Mouth/Throat: Oropharynx is clear and moist.  Eyes: Conjunctivae and EOM are normal. Pupils are equal, round, and reactive to light.  Neck: Normal range of motion. Neck supple. No thyromegaly present.  Cardiovascular: Normal rate, regular rhythm, normal heart sounds and intact distal pulses.   Pulmonary/Chest: Effort normal and breath sounds normal.  Abdominal: Soft. Bowel sounds are normal. She exhibits no mass. There is no tenderness.  Musculoskeletal: Normal range of motion.  Lymphadenopathy:    She has no cervical adenopathy.  Neurological: She is alert and oriented to person, place, and time.  Skin: Skin is warm and dry. No rash noted.  Psychiatric: She has a normal mood and affect. Her behavior is normal.          Assessment & Plan:  Preventive health examination History colonic polyps.  Follow-up colonoscopy in 2 years Anxiety, depression.  Follow-up psychiatry  We'll check updated lab Recheck one year or as needed  Medications refilled

## 2014-05-30 ENCOUNTER — Telehealth (HOSPITAL_COMMUNITY): Payer: Self-pay | Admitting: *Deleted

## 2014-05-30 LAB — COMPREHENSIVE METABOLIC PANEL
ALT: 9 U/L (ref 0–35)
AST: 22 U/L (ref 0–37)
Albumin: 4.3 g/dL (ref 3.5–5.2)
Alkaline Phosphatase: 68 U/L (ref 39–117)
BUN: 10 mg/dL (ref 6–23)
CO2: 31 mEq/L (ref 19–32)
Calcium: 9.8 mg/dL (ref 8.4–10.5)
Chloride: 100 mEq/L (ref 96–112)
Creatinine, Ser: 0.62 mg/dL (ref 0.40–1.20)
GFR: 105.85 mL/min (ref >60.00)
Glucose, Bld: 86 mg/dL (ref 70–99)
Potassium: 5 mEq/L (ref 3.5–5.1)
Sodium: 134 mEq/L — ABNORMAL LOW (ref 135–145)
Total Bilirubin: 0.6 mg/dL (ref 0.2–1.2)
Total Protein: 6.8 g/dL (ref 6.0–8.3)

## 2014-05-30 LAB — CBC WITH DIFFERENTIAL/PLATELET
Basophils Absolute: 0.3 10*3/uL — ABNORMAL HIGH (ref 0.0–0.1)
Basophils Relative: 3.3 % — ABNORMAL HIGH (ref 0.0–3.0)
Eosinophils Absolute: 0.2 10*3/uL (ref 0.0–0.7)
Eosinophils Relative: 2.2 % (ref 0.0–5.0)
HCT: 39.4 % (ref 36.0–46.0)
Hemoglobin: 13.6 g/dL (ref 12.0–15.0)
Lymphocytes Relative: 40.6 % (ref 12.0–46.0)
Lymphs Abs: 3.9 10*3/uL (ref 0.7–4.0)
MCHC: 34.4 g/dL (ref 30.0–36.0)
MCV: 91.2 fl (ref 78.0–100.0)
Monocytes Absolute: 0.6 10*3/uL (ref 0.1–1.0)
Monocytes Relative: 6.6 % (ref 3.0–12.0)
Neutro Abs: 4.5 10*3/uL (ref 1.4–7.7)
Neutrophils Relative %: 47.3 % (ref 43.0–77.0)
Platelets: 239 10*3/uL (ref 150.0–400.0)
RBC: 4.32 Mil/uL (ref 3.87–5.11)
RDW: 14.1 % (ref 11.5–15.5)
WBC: 9.5 10*3/uL (ref 4.0–10.5)

## 2014-05-30 LAB — LIPID PANEL
Cholesterol: 187 mg/dL (ref 0–200)
HDL: 63.8 mg/dL (ref >39.00)
LDL Cholesterol: 100 mg/dL — ABNORMAL HIGH (ref 0–99)
NonHDL: 123.2
Total CHOL/HDL Ratio: 3
Triglycerides: 116 mg/dL (ref 0.0–149.0)
VLDL: 23.2 mg/dL (ref 0.0–40.0)

## 2014-05-30 LAB — TSH: TSH: 1.54 u[IU]/mL (ref 0.35–4.50)

## 2014-05-30 NOTE — Telephone Encounter (Signed)
Patient called to cancel appointment.  Patient stated that PCP will start handling her RX's, Dr. Adele Schilder no longer needed.  Refill for Xanax already sent in by PCP. I advised patient I will make note in her chart and cancel her appointment for 05-31-2014. Patient verbalized understanding.

## 2014-05-31 ENCOUNTER — Ambulatory Visit (HOSPITAL_COMMUNITY): Payer: Self-pay | Admitting: Psychiatry

## 2014-06-15 ENCOUNTER — Telehealth: Payer: Self-pay | Admitting: Internal Medicine

## 2014-06-15 NOTE — Telephone Encounter (Signed)
Pt needs blood work results °

## 2014-06-15 NOTE — Telephone Encounter (Signed)
Please advise 

## 2014-06-15 NOTE — Telephone Encounter (Signed)
Spoke to pt told her labs were normal per Dr.K and Cholesterol was 187. Pt verbalized understanding.

## 2014-06-15 NOTE — Telephone Encounter (Signed)
Please call/notify patient that lab/test/procedure is normal.  Cholesterol 187

## 2014-09-18 ENCOUNTER — Other Ambulatory Visit: Payer: Self-pay | Admitting: Orthopedic Surgery

## 2015-01-06 ENCOUNTER — Other Ambulatory Visit: Payer: Self-pay | Admitting: Internal Medicine

## 2015-01-18 ENCOUNTER — Telehealth: Payer: Self-pay | Admitting: Internal Medicine

## 2015-01-18 MED ORDER — ESCITALOPRAM OXALATE 10 MG PO TABS: ORAL_TABLET | ORAL | Status: DC

## 2015-01-18 NOTE — Telephone Encounter (Signed)
Rx sent to pharmacy   

## 2015-01-18 NOTE — Telephone Encounter (Signed)
Pt request refill of the following: escitalopram (LEXAPRO) 10 MG tablet   Phamacy: [facility]

## 2015-01-20 ENCOUNTER — Encounter (HOSPITAL_COMMUNITY): Payer: Self-pay | Admitting: Emergency Medicine

## 2015-01-20 ENCOUNTER — Emergency Department (INDEPENDENT_AMBULATORY_CARE_PROVIDER_SITE_OTHER)
Admission: EM | Admit: 2015-01-20 | Discharge: 2015-01-20 | Disposition: A | Discharge status: Home / Self Care | Payer: Managed Care, Other (non HMO) | Source: Home / Self Care | Attending: Family Medicine | Admitting: Family Medicine

## 2015-01-20 ENCOUNTER — Emergency Department (INDEPENDENT_AMBULATORY_CARE_PROVIDER_SITE_OTHER): Payer: Managed Care, Other (non HMO)

## 2015-01-20 DIAGNOSIS — J439 Emphysema, unspecified: Secondary | ICD-10-CM | POA: Diagnosis not present

## 2015-01-20 DIAGNOSIS — R0982 Postnasal drip: Secondary | ICD-10-CM

## 2015-01-20 DIAGNOSIS — J9801 Acute bronchospasm: Secondary | ICD-10-CM

## 2015-01-20 DIAGNOSIS — R0789 Other chest pain: Secondary | ICD-10-CM

## 2015-01-20 MED ORDER — PREDNISONE 20 MG PO TABS: ORAL_TABLET | ORAL | Status: DC

## 2015-01-20 MED ORDER — DEXAMETHASONE SODIUM PHOSPHATE 10 MG/ML IJ SOLN
INTRAMUSCULAR | Status: AC
  Filled 2015-01-20: qty 1

## 2015-01-20 MED ORDER — ALBUTEROL SULFATE HFA 108 (90 BASE) MCG/ACT IN AERS: 2.0000 | INHALATION_SPRAY | RESPIRATORY_TRACT | Status: DC | PRN

## 2015-01-20 MED ORDER — IPRATROPIUM-ALBUTEROL 0.5-2.5 (3) MG/3ML IN SOLN
RESPIRATORY_TRACT | Status: AC
  Filled 2015-01-20: qty 3

## 2015-01-20 MED ORDER — IPRATROPIUM-ALBUTEROL 0.5-2.5 (3) MG/3ML IN SOLN
3.0000 mL | Freq: Once | RESPIRATORY_TRACT | Status: AC
  Administered 2015-01-20: 3 mL via RESPIRATORY_TRACT

## 2015-01-20 MED ORDER — DEXAMETHASONE SODIUM PHOSPHATE 10 MG/ML IJ SOLN
10.0000 mg | Freq: Once | INTRAMUSCULAR | Status: AC
  Administered 2015-01-20: 10 mg via INTRAMUSCULAR

## 2015-01-20 NOTE — ED Provider Notes (Signed)
CSN: [id number]     Arrival date & time 01/20/15  1335 History   First MD Initiated Contact with Patient 01/20/15 1450     Chief Complaint  Patient presents with  . URI   (Consider location/radiation/quality/duration/timing/severity/associated sxs/prior Treatment) HPI Comments: 55 year old female is complaining of pain to the left posterior, lateral and anterior chest associated with cough, deep breathing and movement. She states she has a cough every day. She admits that she has been smoking for 37 years and is currently smoking three quarters of a pack per day. She states that occasionally when coughing she will have some sputum. Denies hemoptysis. When asked about shortness of breath she says " it might be a little bit".   Past Medical History  Diagnosis Date  . Endometriosis   . Psoriasis   . Calcification of aorta (HCC)     Abdominal aorta  . CIN I (cervical intraepithelial neoplasia I)   . Hemorrhoid   . Dysplastic colon polyp   . Anxiety   . Seasonal allergies   . Depression   . Arthritis   . Anemia     "borderline"  . Diverticulosis   . Asthma     "bronchial asthmatic, flares up seasonally"   Past Surgical History  Procedure Laterality Date  . Oophorectomy  2002    DX LAP W/RSO  . Oophorectomy  2000    TAH W LSO  . Abdominal hysterectomy  2000    TAH,LSO (RSO IN 2002)  . Appendectomy  1982    LYSIS OF BOWEL ADHESIONS  . Colposcopy    . Gynecologic cryosurgery    . Pelvic laparoscopy      DL  . Tonsillectomy    . Colonoscopy    . Polypectomy    . Colon surgery      "small amount of colon taken at time of oophorectomy in 2002"  . Lipoma excision      removed from back and right foot"done at different times"  . Mass excision  12/24/2011    Procedure: EXCISION MASS;  Surgeon: Newt Minion, MD;  Location: [facility];  Service: Orthopedics;  Laterality: Right;  Right Foot Excision Soft Tissue Mass   Family History  Problem Relation Age of Onset  . Diabetes Mother    . Hypertension Mother   . Lymphoma Father   . Cancer Father   . Diabetes Maternal Grandmother   . Hypertension Maternal Grandmother   . Heart disease Maternal Grandmother    Social History  Substance Use Topics  . Smoking status: Current Every Day Smoker -- 0.75 packs/day    Types: Cigarettes  . Smokeless tobacco: Never Used  . Alcohol Use: 3.0 oz/week    5 Cans of beer per week     Comment: "social"   OB History    Gravida Para Term Preterm AB TAB SAB Ectopic Multiple Living   1    1     0     Review of Systems  Constitutional: Positive for activity change. Negative for fever.  HENT: Positive for congestion and postnasal drip. Negative for ear pain and sore throat.   Respiratory: Positive for cough and chest tightness.   Cardiovascular: Positive for chest pain. Negative for palpitations and leg swelling.  Gastrointestinal: Negative.   Musculoskeletal: Positive for back pain. Negative for gait problem and neck pain.  Skin: Negative.   Neurological: Negative.   All other systems reviewed and are negative.   Allergies  Codeine  Home Medications  Prior to Admission medications   Medication Sig Start Date End Date Taking? Authorizing Provider  ALPRAZolam Duanne Moron) 0.5 MG tablet TAKE 1 TABLET BY MOUTH 3 TIMES A DAY AS NEEDED 01/09/15  Yes Marletta Lor, MD  aspirin EC 81 MG tablet Take 81 mg by mouth every other day.   Yes Historical Provider, MD  Calcium Carbonate-Vitamin D (CALCIUM + D PO) Take 1 tablet by mouth every other day.    Yes Historical Provider, MD  escitalopram (LEXAPRO) 10 MG tablet TAKE 1 TABLET (10 MG TOTAL) BY MOUTH AT BEDTIME. 01/18/15  Yes Marletta Lor, MD  albuterol (PROVENTIL HFA;VENTOLIN HFA) 108 (90 Base) MCG/ACT inhaler Inhale 2 puffs into the lungs every 4 (four) hours as needed for wheezing or shortness of breath. 01/20/15   Janne Napoleon, NP  omeprazole (PRILOSEC OTC) 20 MG tablet Take 20 mg by mouth as needed.     Historical Provider, MD   predniSONE (DELTASONE) 20 MG tablet 3 Tabs PO Days 1-3, then 2 tabs PO Days 4-6, then 1 tab PO Day 7-9, then Half Tab PO Day 10-12 01/20/15   Janne Napoleon, NP   Meds Ordered and Administered this Visit  Medications - No data to display  BP 126/83 mmHg  Pulse 68  Temp(Src) 98 F (36.7 C) (Oral)  Resp 18  SpO2 100% No data found.   Physical Exam  Constitutional: She appears well-developed and well-nourished. No distress.  HENT:  Oropharynx with minor erythema and clear PND. No exudates.  Eyes: EOM are normal.  Neck: Normal range of motion. Neck supple.  Cardiovascular: Normal rate, regular rhythm and normal heart sounds.   Pulmonary/Chest: She exhibits tenderness.  Bilateral breath sounds overall diminished. Decreased and chest expansion. No adventitious sounds. No wheezing.  There is tenderness over the left lower thoracic ribs in the posterior and lateral chest. This reproduces the pain for which she has been complaining associated with coughing and movement. There is also minor tenderness across the left and right anterior chest and upper parasternal borders.  Musculoskeletal: She exhibits no edema.  Lymphadenopathy:    She has no cervical adenopathy.  Neurological: She is alert. She exhibits normal muscle tone. Coordination normal.  Skin: Skin is warm and dry. No rash noted.  Psychiatric: She has a normal mood and affect.  Nursing note and vitals reviewed.   ED Course  Procedures (including critical care time)  Labs Review Labs Reviewed - No data to display  Imaging Review Dg Chest 2 View  01/20/2015  CLINICAL DATA:  Productive cough 8 days. EXAM: CHEST  2 VIEW COMPARISON:  03/25/2012 FINDINGS: Lungs are hyperexpanded with mild flattening of the hemidiaphragms and slight increased AP diameter of the chest. No focal consolidation or effusion. Cardiomediastinal silhouette and remainder of the exam is unchanged. IMPRESSION: No acute cardiopulmonary disease. COPD.  Electronically Signed   By: Marin Olp M.D.   On: 01/20/2015 15:36     Visual Acuity Review  Right Eye Distance:   Left Eye Distance:   Bilateral Distance:    Right Eye Near:   Left Eye Near:    Bilateral Near:         MDM   1. Pulmonary emphysema, unspecified emphysema type ([place])   2. Cough due to bronchospasm   3. PND (post-nasal drip)    DuoNeb 2.5/5 mg 1. Post Duoneb may discharge. Decadron 10 mg IM  Chronic Obstructive Pulmonary Disease He will need to follow-up with your primary care doctor. Your chest  x-ray shows that you have emphysema. Use her albuterol HFA 2 puffs every 4-6 hours as needed for cough and wheeze Prednisone taper dose as directed Robitussin-DM for cough and loosening phlegm. Recommend taking Claritin or Allegra for drainage. Time to stop smoking.  Janne Napoleon, NP 01/20/15 [phone]

## 2015-01-20 NOTE — ED Notes (Signed)
C/o cold sx onset x8 days associated w/prod cough and congestion Has now developed back pain that radiates to left side ribcage... Pain increases w/cough, breathing, sneezing Smokes 0.75 PPD A&O x4... No acute distress.

## 2015-01-20 NOTE — Discharge Instructions (Signed)
Chronic Obstructive Pulmonary Disease He will need to follow-up with your primary care doctor. Your chest x-ray shows that you have emphysema. Use her albuterol HFA 2 puffs every 4-6 hours as needed for cough and wheeze Prednisone taper dose as directed Robitussin-DM for cough and loosening phlegm. Recommend taking Claritin or Allegra for drainage. Time to stop smoking. Chronic obstructive pulmonary disease (COPD) is a common lung condition in which airflow from the lungs is limited. COPD is a general term that can be used to describe many different lung problems that limit airflow, including both chronic bronchitis and emphysema. If you have COPD, your lung function will probably never return to normal, but there are measures you can take to improve lung function and make yourself feel better. CAUSES   Smoking (common).  Exposure to secondhand smoke.  Genetic problems.  Chronic inflammatory lung diseases or recurrent infections. SYMPTOMS  Shortness of breath, especially with physical activity.  Deep, persistent (chronic) cough with a large amount of thick mucus.  Wheezing.  Rapid breaths (tachypnea).  Gray or bluish discoloration (cyanosis) of the skin, especially in your fingers, toes, or lips.  Fatigue.  Weight loss.  Frequent infections or episodes when breathing symptoms become much worse (exacerbations).  Chest tightness. DIAGNOSIS Your health care provider will take a medical history and perform a physical examination to diagnose COPD. Additional tests for COPD may include:  Lung (pulmonary) function tests.  Chest X-ray.  CT scan.  Blood tests. TREATMENT  Treatment for COPD may include:  Inhaler and nebulizer medicines. These help manage the symptoms of COPD and make your breathing more comfortable.  Supplemental oxygen. Supplemental oxygen is only helpful if you have a low oxygen level in your blood.  Exercise and physical activity. These are beneficial  for nearly all people with COPD.  Lung surgery or transplant.  Nutrition therapy to gain weight, if you are underweight.  Pulmonary rehabilitation. This may involve working with a team of health care providers and specialists, such as respiratory, occupational, and physical therapists. HOME CARE INSTRUCTIONS  Take all medicines (inhaled or pills) as directed by your health care provider.  Avoid over-the-counter medicines or cough syrups that dry up your airway (such as antihistamines) and slow down the elimination of secretions unless instructed otherwise by your health care provider.  If you are a smoker, the most important thing that you can do is stop smoking. Continuing to smoke will cause further lung damage and breathing trouble. Ask your health care provider for help with quitting smoking. He or she can direct you to community resources or hospitals that provide support.  Avoid exposure to irritants such as smoke, chemicals, and fumes that aggravate your breathing.  Use oxygen therapy and pulmonary rehabilitation if directed by your health care provider. If you require home oxygen therapy, ask your health care provider whether you should purchase a pulse oximeter to measure your oxygen level at home.  Avoid contact with individuals who have a contagious illness.  Avoid extreme temperature and humidity changes.  Eat healthy foods. Eating smaller, more frequent meals and resting before meals may help you maintain your strength.  Stay active, but balance activity with periods of rest. Exercise and physical activity will help you maintain your ability to do things you want to do.  Preventing infection and hospitalization is very important when you have COPD. Make sure to receive all the vaccines your health care provider recommends, especially the pneumococcal and influenza vaccines. Ask your health care provider whether  you need a pneumonia vaccine.  Learn and use relaxation  techniques to manage stress.  Learn and use controlled breathing techniques as directed by your health care provider. Controlled breathing techniques include:  Pursed lip breathing. Start by breathing in (inhaling) through your nose for 1 second. Then, purse your lips as if you were going to whistle and breathe out (exhale) through the pursed lips for 2 seconds.  Diaphragmatic breathing. Start by putting one hand on your abdomen just above your waist. Inhale slowly through your nose. The hand on your abdomen should move out. Then purse your lips and exhale slowly. You should be able to feel the hand on your abdomen moving in as you exhale.  Learn and use controlled coughing to clear mucus from your lungs. Controlled coughing is a series of short, progressive coughs. The steps of controlled coughing are:  Lean your head slightly forward.  Breathe in deeply using diaphragmatic breathing.  Try to hold your breath for 3 seconds.  Keep your mouth slightly open while coughing twice.  Spit any mucus out into a tissue.  Rest and repeat the steps once or twice as needed. SEEK MEDICAL CARE IF:  You are coughing up more mucus than usual.  There is a change in the color or thickness of your mucus.  Your breathing is more labored than usual.  Your breathing is faster than usual. SEEK IMMEDIATE MEDICAL CARE IF:  You have shortness of breath while you are resting.  You have shortness of breath that prevents you from:  Being able to talk.  Performing your usual physical activities.  You have chest pain lasting longer than 5 minutes.  Your skin color is more cyanotic than usual.  You measure low oxygen saturations for longer than 5 minutes with a pulse oximeter. MAKE SURE YOU:  Understand these instructions.  Will watch your condition.  Will get help right away if you are not doing well or get worse.   This information is not intended to replace advice given to you by your health  care provider. Make sure you discuss any questions you have with your health care provider.   Document Released: 10/02/2004 Document Revised: 01/13/2014 Document Reviewed: 08/19/2012 Elsevier Interactive Patient Education 2016 Elsevier Inc[employer]  Bronchospasm, Adult A bronchospasm is a spasm or tightening of the airways going into the lungs. During a bronchospasm breathing becomes more difficult because the airways get smaller. When this happens there can be coughing, a whistling sound when breathing (wheezing), and difficulty breathing. Bronchospasm is often associated with asthma, but not all patients who experience a bronchospasm have asthma. CAUSES  A bronchospasm is caused by inflammation or irritation of the airways. The inflammation or irritation may be triggered by:   Allergies (such as to animals, pollen, food, or mold). Allergens that cause bronchospasm may cause wheezing immediately after exposure or many hours later.   Infection. Viral infections are believed to be the most common cause of bronchospasm.   Exercise.   Irritants (such as pollution, cigarette smoke, strong odors, aerosol sprays, and paint fumes).   Weather changes. Winds increase molds and pollens in the air. Rain refreshes the air by washing irritants out. Cold air may cause inflammation.   Stress and emotional upset.  SIGNS AND SYMPTOMS   Wheezing.   Excessive nighttime coughing.   Frequent or severe coughing with a simple cold.   Chest tightness.   Shortness of breath.  DIAGNOSIS  Bronchospasm is usually diagnosed through a history and physical  exam. Tests, such as chest X-rays, are sometimes done to look for other conditions. TREATMENT   Inhaled medicines can be given to open up your airways and help you breathe. The medicines can be given using either an inhaler or a nebulizer machine.  Corticosteroid medicines may be given for severe bronchospasm, usually when it is associated with  asthma. HOME CARE INSTRUCTIONS   Always have a plan prepared for seeking medical care. Know when to call your health care provider and local emergency services (911 in the [place] Know where you can access local emergency care.  Only take medicines as directed by your health care provider.  If you were prescribed an inhaler or nebulizer machine, ask your health care provider to explain how to use it correctly. Always use a spacer with your inhaler if you were given one.  It is necessary to remain calm during an attack. Try to relax and breathe more slowly.  Control your home environment in the following ways:   Change your heating and air conditioning filter at least once a month.   Limit your use of fireplaces and wood stoves.  Do not smoke and do not allow smoking in your home.   Avoid exposure to perfumes and fragrances.   Get rid of pests (such as roaches and mice) and their droppings.   Throw away plants if you see mold on them.   Keep your house clean and dust free.   Replace carpet with wood, tile, or vinyl flooring. Carpet can trap dander and dust.   Use allergy-proof pillows, mattress covers, and box spring covers.   Wash bed sheets and blankets every week in hot water and dry them in a dryer.   Use blankets that are made of polyester or cotton.   Wash hands frequently. SEEK MEDICAL CARE IF:   You have muscle aches.   You have chest pain.   The sputum changes from clear or white to yellow, green, gray, or bloody.   The sputum you cough up gets thicker.   There are problems that may be related to the medicine you are given, such as a rash, itching, swelling, or trouble breathing.  SEEK IMMEDIATE MEDICAL CARE IF:   You have worsening wheezing and coughing even after taking your prescribed medicines.   You have increased difficulty breathing.   You develop severe chest pain. MAKE SURE YOU:   Understand these instructions.  Will watch  your condition.  Will get help right away if you are not doing well or get worse.   This information is not intended to replace advice given to you by your health care provider. Make sure you discuss any questions you have with your health care provider.   Document Released: 12/26/2002 Document Revised: 01/13/2014 Document Reviewed: 06/14/2012 Elsevier Interactive Patient Education 2016 [employer].  How to Use an Inhaler Using your inhaler correctly is very important. Good technique will make sure that the medicine reaches your lungs.  HOW TO USE AN INHALER:  Take the cap off the inhaler.  If this is the first time using your inhaler, you need to prime it. Shake the inhaler for 5 seconds. Release four puffs into the air, away from your face. Ask your doctor for help if you have questions.  Shake the inhaler for 5 seconds.  Turn the inhaler so the bottle is above the mouthpiece.  Put your pointer finger on top of the bottle. Your thumb holds the bottom of the inhaler.  Open  your mouth.  Either hold the inhaler away from your mouth (the width of 2 fingers) or place your lips tightly around the mouthpiece. Ask your doctor which way to use your inhaler.  Breathe out as much air as possible.  Breathe in and push down on the bottle 1 time to release the medicine. You will feel the medicine go in your mouth and throat.  Continue to take a deep breath in very slowly. Try to fill your lungs.  After you have breathed in completely, hold your breath for 10 seconds. This will help the medicine to settle in your lungs. If you cannot hold your breath for 10 seconds, hold it for as long as you can before you breathe out.  Breathe out slowly, through pursed lips. Whistling is an example of pursed lips.  If your doctor has told you to take more than 1 puff, wait at least 15-30 seconds between puffs. This will help you get the best results from your medicine. Do not use the inhaler more than  your doctor tells you to.  Put the cap back on the inhaler.  Follow the directions from your doctor or from the inhaler package about cleaning the inhaler. If you use more than one inhaler, ask your doctor which inhalers to use and what order to use them in. Ask your doctor to help you figure out when you will need to refill your inhaler.  If you use a steroid inhaler, always rinse your mouth with water after your last puff, gargle and spit out the water. Do not swallow the water. GET HELP IF:  The inhaler medicine only partially helps to stop wheezing or shortness of breath.  You are having trouble using your inhaler.  You have some increase in thick spit (phlegm). GET HELP RIGHT AWAY IF:  The inhaler medicine does not help your wheezing or shortness of breath or you have tightness in your chest.  You have dizziness, headaches, or fast heart rate.  You have chills, fever, or night sweats.  You have a large increase of thick spit, or your thick spit is bloody. MAKE SURE YOU:   Understand these instructions.  Will watch your condition.  Will get help right away if you are not doing well or get worse.   This information is not intended to replace advice given to you by your health care provider. Make sure you discuss any questions you have with your health care provider.   Document Released: 10/02/2007 Document Revised: 10/13/2012 Document Reviewed: 07/22/2012 Elsevier Interactive Patient Education 2016 Elsevier Inc.  Cough, Adult A cough helps to clear your throat and lungs. A cough may last only 2-3 weeks (acute), or it may last longer than 8 weeks (chronic). Many different things can cause a cough. A cough may be a sign of an illness or another medical condition. HOME CARE  Pay attention to any changes in your cough.  Take medicines only as told by your doctor.  If you were prescribed an antibiotic medicine, take it as told by your doctor. Do not stop taking it even if  you start to feel better.  Talk with your doctor before you try using a cough medicine.  Drink enough fluid to keep your pee (urine) clear or pale yellow.  If the air is dry, use a cold steam vaporizer or humidifier in your home.  Stay away from things that make you cough at work or at home.  If your cough is worse at  night, try using extra pillows to raise your head up higher while you sleep.  Do not smoke, and try not to be around smoke. If you need help quitting, ask your doctor.  Do not have caffeine.  Do not drink alcohol.  Rest as needed. GET HELP IF:  You have new problems (symptoms).  You cough up yellow fluid (pus).  Your cough does not get better after 2-3 weeks, or your cough gets worse.  Medicine does not help your cough and you are not sleeping well.  You have pain that gets worse or pain that is not helped with medicine.  You have a fever.  You are losing weight and you do not know why.  You have night sweats. GET HELP RIGHT AWAY IF:  You cough up blood.  You have trouble breathing.  Your heartbeat is very fast.   This information is not intended to replace advice given to you by your health care provider. Make sure you discuss any questions you have with your health care provider.   Document Released: 09/05/2010 Document Revised: 09/13/2014 Document Reviewed: 03/01/2014 Elsevier Interactive Patient Education [employer].

## 2015-02-27 ENCOUNTER — Ambulatory Visit (INDEPENDENT_AMBULATORY_CARE_PROVIDER_SITE_OTHER): Payer: Managed Care, Other (non HMO) | Admitting: Family Medicine

## 2015-02-27 ENCOUNTER — Encounter: Payer: Self-pay | Admitting: Family Medicine

## 2015-02-27 VITALS — BP 122/80 | HR 116 | Temp 100.5°F | Ht 65.5 in | Wt 104.4 lb

## 2015-02-27 DIAGNOSIS — J438 Other emphysema: Secondary | ICD-10-CM | POA: Diagnosis not present

## 2015-02-27 DIAGNOSIS — R6889 Other general symptoms and signs: Secondary | ICD-10-CM | POA: Diagnosis not present

## 2015-02-27 LAB — POCT INFLUENZA A/B
Influenza A, POC: NEGATIVE
Influenza B, POC: NEGATIVE

## 2015-02-27 MED ORDER — BENZONATATE 100 MG PO CAPS: 100.0000 mg | ORAL_CAPSULE | Freq: Three times a day (TID) | ORAL | Status: DC | PRN

## 2015-02-27 MED ORDER — PREDNISONE 20 MG PO TABS: 40.0000 mg | ORAL_TABLET | Freq: Every day | ORAL | Status: DC

## 2015-02-27 MED ORDER — OSELTAMIVIR PHOSPHATE 75 MG PO CAPS: 75.0000 mg | ORAL_CAPSULE | Freq: Two times a day (BID) | ORAL | Status: DC

## 2015-02-27 NOTE — Addendum Note (Signed)
Addended by: Lucretia Kern on: 02/27/2015 02:45 PM   Modules accepted: Orders

## 2015-02-27 NOTE — Progress Notes (Addendum)
HPI:  Acute visit for flu like symptoms: -started: last night -symptoms:lots of nasal congestion, body aches, chills, fever, sore throat, cough -denies:SOB, NVD, tooth pain, wheezing -has tried: albuterol which helps some -sick contacts/travel/risks: husband with the same and he is here today as well -reports recent dx emphysema, did not have flu shot  ROS: See pertinent positives and negatives per HPI.  Past Medical History  Diagnosis Date  . Endometriosis   . Psoriasis   . Calcification of aorta (HCC)     Abdominal aorta  . CIN I (cervical intraepithelial neoplasia I)   . Hemorrhoid   . Dysplastic colon polyp   . Anxiety   . Seasonal allergies   . Depression   . Arthritis   . Anemia     "borderline"  . Diverticulosis   . Asthma     "bronchial asthmatic, flares up seasonally"    Past Surgical History  Procedure Laterality Date  . Oophorectomy  2002    DX LAP W/RSO  . Oophorectomy  2000    TAH W LSO  . Abdominal hysterectomy  2000    TAH,LSO (RSO IN 2002)  . Appendectomy  1982    LYSIS OF BOWEL ADHESIONS  . Colposcopy    . Gynecologic cryosurgery    . Pelvic laparoscopy      DL  . Tonsillectomy    . Colonoscopy    . Polypectomy    . Colon surgery      "small amount of colon taken at time of oophorectomy in 2002"  . Lipoma excision      removed from back and right foot"done at different times"  . Mass excision  12/24/2011    Procedure: EXCISION MASS;  Surgeon: Newt Minion, MD;  Location: [facility];  Service: Orthopedics;  Laterality: Right;  Right Foot Excision Soft Tissue Mass    Family History  Problem Relation Age of Onset  . Diabetes Mother   . Hypertension Mother   . Lymphoma Father   . Cancer Father   . Diabetes Maternal Grandmother   . Hypertension Maternal Grandmother   . Heart disease Maternal Grandmother     Social History   Social History  . Marital Status: Married    Spouse Name: N/A  . Number of Children: N/A  . Years of  Education: N/A   Social History Main Topics  . Smoking status: Current Every Day Smoker -- 0.75 packs/day    Types: Cigarettes  . Smokeless tobacco: Never Used  . Alcohol Use: 3.0 oz/week    5 Cans of beer per week     Comment: "social"  . Drug Use: No  . Sexual Activity: Yes    Birth Control/ Protection: Surgical   Other Topics Concern  . None   Social History Narrative     Current outpatient prescriptions:  .  albuterol (PROVENTIL HFA;VENTOLIN HFA) 108 (90 Base) MCG/ACT inhaler, Inhale 2 puffs into the lungs every 4 (four) hours as needed for wheezing or shortness of breath., Disp: 1 Inhaler, Rfl: 0 .  ALPRAZolam (XANAX) 0.5 MG tablet, TAKE 1 TABLET BY MOUTH 3 TIMES A DAY AS NEEDED, Disp: 90 tablet, Rfl: 2 .  aspirin EC 81 MG tablet, Take 81 mg by mouth every other day., Disp: , Rfl:  .  Calcium Carbonate-Vitamin D (CALCIUM + D PO), Take 1 tablet by mouth every other day. , Disp: , Rfl:  .  escitalopram (LEXAPRO) 10 MG tablet, TAKE 1 TABLET (10 MG TOTAL) BY  MOUTH AT BEDTIME., Disp: 90 tablet, Rfl: 1  EXAM:  Filed Vitals:   02/27/15 1359  BP: 122/80  Pulse: 116  Temp: 100.5 F (38.1 C)    Body mass index is 17.1 kg/(m^2).  GENERAL: vitals reviewed and listed above, alert, oriented, appears well hydrated and in no acute distress  HEENT: atraumatic, conjunttiva clear, no obvious abnormalities on inspection of external nose and ears, normal appearance of ear canals and TMs, clear nasal congestion, mild post oropharyngeal erythema with PND, no tonsillar edema or exudate, no sinus TTP  NECK: no obvious masses on inspection  LUNGS: clear to auscultation bilaterally, no rales or rhonchi, good air movement, scattered exp wheeze  CV: HRRR, no peripheral edema  MS: moves all extremities without noticeable abnormality  PSYCH: pleasant and cooperative, no obvious depression or anxiety  ASSESSMENT AND PLAN:  Discussed the following assessment and plan:  Flu-like  symptoms  Other emphysema (HCC)  Rapid flu neg, but husband rapid flu test positive.  We discussed potential etiologies, with influenza like illness being most likely, and advised tamiflu after discussion risks/benefits given her lung disease. Also opted for prednisone given wheezing. We discussed treatment side effects, likely course, and signs of developing a secondary bacterial infection or serious illness. -of course, we advised to return or notify a doctor immediately if symptoms worsen or persist or new concerns arise.    Patient Instructions  Influenza, Adult Influenza ("the flu") is a viral infection of the respiratory tract. It occurs more often in winter months because people spend more time in close contact with one another. Influenza can make you feel very sick. Influenza easily spreads from person to person (contagious). CAUSES  Influenza is caused by a virus that infects the respiratory tract. You can catch the virus by breathing in droplets from an infected person's cough or sneeze. You can also catch the virus by touching something that was recently contaminated with the virus and then touching your mouth, nose, or eyes. RISKS AND COMPLICATIONS You may be at risk for a more severe case of influenza if you smoke cigarettes, have diabetes, have chronic heart disease (such as heart failure) or lung disease (such as asthma), or if you have a weakened immune system. Elderly people and pregnant women are also at risk for more serious infections. The most common problem of influenza is a lung infection (pneumonia). Sometimes, this problem can require emergency medical care and may be life threatening. SIGNS AND SYMPTOMS  Symptoms typically last 4 to 10 days and may include:  Fever.  Chills.  Headache, body aches, and muscle aches.  Sore throat.  Chest discomfort and cough.  Poor appetite.  Weakness or feeling tired.  Dizziness.  Nausea or vomiting. DIAGNOSIS  Diagnosis  of influenza is often made based on your history and a physical exam. A nose or throat swab test can be done to confirm the diagnosis. TREATMENT  In mild cases, influenza goes away on its own. Treatment is directed at relieving symptoms. For more severe cases, your health care provider may prescribe antiviral medicines to shorten the sickness. Antibiotic medicines are not effective because the infection is caused by a virus, not by bacteria. HOME CARE INSTRUCTIONS  Take medicines only as directed by your health care provider.  Use a cool mist humidifier to make breathing easier.  Get plenty of rest until your temperature returns to normal. This usually takes 3 to 4 days.  Drink enough fluid to keep your urine clear or pale  yellow.  Cover yourmouth and nosewhen coughing or sneezing,and wash your handswellto prevent thevirusfrom spreading.  Stay homefromwork orschool untilthe fever is gonefor at least 54full day. PREVENTION  An annual influenza vaccination (flu shot) is the best way to avoid getting influenza. An annual flu shot is now routinely recommended for all adults in the [place] IF:  You experiencechest pain, yourcough worsens,or you producemore mucus.  Youhave nausea,vomiting, ordiarrhea.  Your fever returns or gets worse. SEEK IMMEDIATE MEDICAL CARE IF:  You havetrouble breathing, you become short of breath,or your skin ornails becomebluish.  You have severe painor stiffnessin the neck.  You develop a sudden headache, or pain in the face or ear.  You have nausea or vomiting that you cannot control. MAKE SURE YOU:   Understand these instructions.  Will watch your condition.  Will get help right away if you are not doing well or get worse.   This information is not intended to replace advice given to you by your health care provider. Make sure you discuss any questions you have with your health care provider.   Document  Released: 12/21/1999 Document Revised: 01/13/2014 Document Reviewed: 03/24/2011 Elsevier Interactive Patient Education 2016 [place], [place]

## 2015-02-27 NOTE — Progress Notes (Signed)
Pre visit review using our clinic review tool, if applicable. No additional management support is needed unless otherwise documented below in the visit note. 

## 2015-02-27 NOTE — Addendum Note (Signed)
Addended by: Agnes Lawrence on: 02/27/2015 02:45 PM   Modules accepted: Orders

## 2015-02-27 NOTE — Patient Instructions (Signed)

## 2015-04-18 ENCOUNTER — Other Ambulatory Visit: Payer: Self-pay | Admitting: Internal Medicine

## 2015-07-22 ENCOUNTER — Other Ambulatory Visit: Payer: Self-pay | Admitting: Internal Medicine

## 2015-07-23 NOTE — Telephone Encounter (Signed)
Okay to refill? 

## 2015-07-24 ENCOUNTER — Other Ambulatory Visit: Payer: Self-pay | Admitting: Emergency Medicine

## 2015-07-24 ENCOUNTER — Other Ambulatory Visit: Payer: Self-pay

## 2015-07-24 MED ORDER — ALPRAZOLAM 0.5 MG PO TABS: 0.5000 mg | ORAL_TABLET | Freq: Three times a day (TID) | ORAL | Status: DC | PRN

## 2015-08-02 ENCOUNTER — Other Ambulatory Visit: Payer: Self-pay | Admitting: Internal Medicine

## 2015-08-02 NOTE — Telephone Encounter (Signed)
Rx refill sent to pharmacy. 

## 2015-11-02 ENCOUNTER — Other Ambulatory Visit: Payer: Self-pay | Admitting: Internal Medicine

## 2015-11-26 ENCOUNTER — Other Ambulatory Visit: Payer: Self-pay

## 2015-12-19 ENCOUNTER — Ambulatory Visit (INDEPENDENT_AMBULATORY_CARE_PROVIDER_SITE_OTHER): Payer: Managed Care, Other (non HMO)

## 2015-12-19 DIAGNOSIS — Z23 Encounter for immunization: Secondary | ICD-10-CM

## 2016-02-13 ENCOUNTER — Other Ambulatory Visit: Payer: Self-pay | Admitting: Internal Medicine

## 2016-03-01 ENCOUNTER — Other Ambulatory Visit: Payer: Self-pay | Admitting: Internal Medicine

## 2016-04-17 ENCOUNTER — Other Ambulatory Visit: Payer: Managed Care, Other (non HMO)

## 2016-04-17 ENCOUNTER — Other Ambulatory Visit: Payer: Self-pay | Admitting: Internal Medicine

## 2016-04-21 ENCOUNTER — Ambulatory Visit (INDEPENDENT_AMBULATORY_CARE_PROVIDER_SITE_OTHER): Payer: Managed Care, Other (non HMO) | Admitting: Internal Medicine

## 2016-04-21 ENCOUNTER — Encounter: Payer: Self-pay | Admitting: Internal Medicine

## 2016-04-21 VITALS — BP 130/94 | HR 66 | Temp 98.1°F | Ht 66.5 in | Wt 103.4 lb

## 2016-04-21 DIAGNOSIS — Z0000 Encounter for general adult medical examination without abnormal findings: Secondary | ICD-10-CM | POA: Diagnosis not present

## 2016-04-21 MED ORDER — ALPRAZOLAM 0.5 MG PO TABS: 0.5000 mg | ORAL_TABLET | Freq: Three times a day (TID) | ORAL | 2 refills | Status: DC | PRN

## 2016-04-21 NOTE — Patient Instructions (Addendum)
Limit your sodium (Salt) intake  Take a calcium supplement, plus [phone] units of vitamin D  Schedule your mammogram.  Schedule your colonoscopy to help detect colon cancer.    It is important that you exercise regularly, at least 20 minutes 3 to 4 times per week.  If you develop chest pain or shortness of breath seek  medical attention.  Smoking tobacco is very bad for your health. You should stop smoking immediately.   Health Maintenance for Postmenopausal Women Menopause is a normal process in which your reproductive ability comes to an end. This process happens gradually over a span of months to years, usually between the ages of 47 and 17. Menopause is complete when you have missed 12 consecutive menstrual periods. It is important to talk with your health care provider about some of the most common conditions that affect postmenopausal women, such as heart disease, cancer, and bone loss (osteoporosis). Adopting a healthy lifestyle and getting preventive care can help to promote your health and wellness. Those actions can also lower your chances of developing some of these common conditions. What should I know about menopause? During menopause, you may experience a number of symptoms, such as:  Moderate-to-severe hot flashes.  Night sweats.  Decrease in sex drive.  Mood swings.  Headaches.  Tiredness.  Irritability.  Memory problems.  Insomnia. Choosing to treat or not to treat menopausal changes is an individual decision that you make with your health care provider. What should I know about hormone replacement therapy and supplements? Hormone therapy products are effective for treating symptoms that are associated with menopause, such as hot flashes and night sweats. Hormone replacement carries certain risks, especially as you become older. If you are thinking about using estrogen or estrogen with progestin treatments, discuss the benefits and risks with your health care  provider. What should I know about heart disease and stroke? Heart disease, heart attack, and stroke become more likely as you age. This may be due, in part, to the hormonal changes that your body experiences during menopause. These can affect how your body processes dietary fats, triglycerides, and cholesterol. Heart attack and stroke are both medical emergencies. There are many things that you can do to help prevent heart disease and stroke:  Have your blood pressure checked at least every 1-2 years. High blood pressure causes heart disease and increases the risk of stroke.  If you are 80-82 years old, ask your health care provider if you should take aspirin to prevent a heart attack or a stroke.  Do not use any tobacco products, including cigarettes, chewing tobacco, or electronic cigarettes. If you need help quitting, ask your health care provider.  It is important to eat a healthy diet and maintain a healthy weight.  Be sure to include plenty of vegetables, fruits, low-fat dairy products, and lean protein.  Avoid eating foods that are high in solid fats, added sugars, or salt (sodium).  Get regular exercise. This is one of the most important things that you can do for your health.  Try to exercise for at least 150 minutes each week. The type of exercise that you do should increase your heart rate and make you sweat. This is known as moderate-intensity exercise.  Try to do strengthening exercises at least twice each week. Do these in addition to the moderate-intensity exercise.  Know your numbers.Ask your health care provider to check your cholesterol and your blood glucose. Continue to have your blood tested as directed by your  health care provider. What should I know about cancer screening? There are several types of cancer. Take the following steps to reduce your risk and to catch any cancer development as early as possible. Breast Cancer  Practice breast self-awareness.  This  means understanding how your breasts normally appear and feel.  It also means doing regular breast self-exams. Let your health care provider know about any changes, no matter how small.  If you are 68 or older, have a clinician do a breast exam (clinical breast exam or CBE) every year. Depending on your age, family history, and medical history, it may be recommended that you also have a yearly breast X-ray (mammogram).  If you have a family history of breast cancer, talk with your health care provider about genetic screening.  If you are at high risk for breast cancer, talk with your health care provider about having an MRI and a mammogram every year.  Breast cancer (BRCA) gene test is recommended for women who have family members with BRCA-related cancers. Results of the assessment will determine the need for genetic counseling and BRCA1 and for BRCA2 testing. BRCA-related cancers include these types:  Breast. This occurs in males or females.  Ovarian.  Tubal. This may also be called fallopian tube cancer.  Cancer of the abdominal or pelvic lining (peritoneal cancer).  Prostate.  Pancreatic. Cervical, Uterine, and Ovarian Cancer  Your health care provider may recommend that you be screened regularly for cancer of the pelvic organs. These include your ovaries, uterus, and vagina. This screening involves a pelvic exam, which includes checking for microscopic changes to the surface of your cervix (Pap test).  For women ages 21-65, health care providers may recommend a pelvic exam and a Pap test every three years. For women ages 30-65, they may recommend the Pap test and pelvic exam, combined with testing for human papilloma virus (HPV), every five years. Some types of HPV increase your risk of cervical cancer. Testing for HPV may also be done on women of any age who have unclear Pap test results.  Other health care providers may not recommend any screening for nonpregnant women who are  considered low risk for pelvic cancer and have no symptoms. Ask your health care provider if a screening pelvic exam is right for you.  If you have had past treatment for cervical cancer or a condition that could lead to cancer, you need Pap tests and screening for cancer for at least 20 years after your treatment. If Pap tests have been discontinued for you, your risk factors (such as having a new sexual partner) need to be reassessed to determine if you should start having screenings again. Some women have medical problems that increase the chance of getting cervical cancer. In these cases, your health care provider may recommend that you have screening and Pap tests more often.  If you have a family history of uterine cancer or ovarian cancer, talk with your health care provider about genetic screening.  If you have vaginal bleeding after reaching menopause, tell your health care provider.  There are currently no reliable tests available to screen for ovarian cancer. Lung Cancer  Lung cancer screening is recommended for adults 69-56 years old who are at high risk for lung cancer because of a history of smoking. A yearly low-dose CT scan of the lungs is recommended if you:  Currently smoke.  Have a history of at least 30 pack-years of smoking and you currently smoke or have quit  within the past 15 years. A pack-year is smoking an average of one pack of cigarettes per day for one year. Yearly screening should:  Continue until it has been 15 years since you quit.  Stop if you develop a health problem that would prevent you from having lung cancer treatment. Colorectal Cancer  This type of cancer can be detected and can often be prevented.  Routine colorectal cancer screening usually begins at age 20 and continues through age 45.  If you have risk factors for colon cancer, your health care provider may recommend that you be screened at an earlier age.  If you have a family history of  colorectal cancer, talk with your health care provider about genetic screening.  Your health care provider may also recommend using home test kits to check for hidden blood in your stool.  A small camera at the end of a tube can be used to examine your colon directly (sigmoidoscopy or colonoscopy). This is done to check for the earliest forms of colorectal cancer.  Direct examination of the colon should be repeated every 5-10 years until age 32. However, if early forms of precancerous polyps or small growths are found or if you have a family history or genetic risk for colorectal cancer, you may need to be screened more often. Skin Cancer  Check your skin from head to toe regularly.  Monitor any moles. Be sure to tell your health care provider:  About any new moles or changes in moles, especially if there is a change in a mole's shape or color.  If you have a mole that is larger than the size of a pencil eraser.  If any of your family members has a history of skin cancer, especially at a young age, talk with your health care provider about genetic screening.  Always use sunscreen. Apply sunscreen liberally and repeatedly throughout the day.  Whenever you are outside, protect yourself by wearing long sleeves, pants, a wide-brimmed hat, and sunglasses. What should I know about osteoporosis? Osteoporosis is a condition in which bone destruction happens more quickly than new bone creation. After menopause, you may be at an increased risk for osteoporosis. To help prevent osteoporosis or the bone fractures that can happen because of osteoporosis, the following is recommended:  If you are 59-76 years old, get at least 1,000 mg of calcium and at least 600 mg of vitamin D per day.  If you are older than age 46 but younger than age 94, get at least 1,200 mg of calcium and at least 600 mg of vitamin D per day.  If you are older than age 44, get at least 1,200 mg of calcium and at least 800 mg of  vitamin D per day. Smoking and excessive alcohol intake increase the risk of osteoporosis. Eat foods that are rich in calcium and vitamin D, and do weight-bearing exercises several times each week as directed by your health care provider. What should I know about how menopause affects my mental health? Depression may occur at any age, but it is more common as you become older. Common symptoms of depression include:  Low or sad mood.  Changes in sleep patterns.  Changes in appetite or eating patterns.  Feeling an overall lack of motivation or enjoyment of activities that you previously enjoyed.  Frequent crying spells. Talk with your health care provider if you think that you are experiencing depression. What should I know about immunizations? It is important that you get  and maintain your immunizations. These include:  Tetanus, diphtheria, and pertussis (Tdap) booster vaccine.  Influenza every year before the flu season begins.  Pneumonia vaccine.  Shingles vaccine. Your health care provider may also recommend other immunizations. This information is not intended to replace advice given to you by your health care provider. Make sure you discuss any questions you have with your health care provider. Document Released: 02/14/2005 Document Revised: 07/13/2015 Document Reviewed: 09/26/2014 Elsevier Interactive Patient Education  2017 [employer].

## 2016-04-21 NOTE — Progress Notes (Signed)
Pre visit review using our clinic review tool, if applicable. No additional management support is needed unless otherwise documented below in the visit note. 

## 2016-04-21 NOTE — Progress Notes (Signed)
Subjective:    Patient ID: Gabrielle Dare, female    DOB: [birth year], 58 y.o.   MRN: [id number]  HPI  56 year old patient who is seen today for a preventive health examination. She has a history colonic polyps and is scheduled for follow-up colonoscopy later this year.  She has had a remote total hysterectomy due to endometriosis.  She has recently been seen by orthopedics for surgery involving her left hand.  She has psoriasis. She is followed by behavioral health with history of depression She has a history of ongoing tobacco use.  Past Medical History:  Diagnosis Date  . Anemia    "borderline"  . Anxiety   . Arthritis   . Asthma    "bronchial asthmatic, flares up seasonally"  . Calcification of aorta (HCC)    Abdominal aorta  . CIN I (cervical intraepithelial neoplasia I)   . Depression   . Diverticulosis   . Dysplastic colon polyp   . Endometriosis   . Hemorrhoid   . Psoriasis   . Seasonal allergies      Social History   Social History  . Marital status: Married    Spouse name: N/A  . Number of children: N/A  . Years of education: N/A   Occupational History  . Not on file.   Social History Main Topics  . Smoking status: Current Every Day Smoker    Packs/day: 0.75    Types: Cigarettes  . Smokeless tobacco: Never Used  . Alcohol use 3.0 oz/week    5 Cans of beer per week     Comment: "social"  . Drug use: No  . Sexual activity: Yes    Birth control/ protection: Surgical   Other Topics Concern  . Not on file   Social History Narrative  . No narrative on file    Past Surgical History:  Procedure Laterality Date  . ABDOMINAL HYSTERECTOMY  2000   TAH,LSO (RSO IN 2002)  . APPENDECTOMY  1982   LYSIS OF BOWEL ADHESIONS  . COLON SURGERY     "small amount of colon taken at time of oophorectomy in 2002"  . COLONOSCOPY    . COLPOSCOPY    . GYNECOLOGIC CRYOSURGERY    . LIPOMA EXCISION     removed from back and right foot"done at different  times"  . MASS EXCISION  12/24/2011   Procedure: EXCISION MASS;  Surgeon: Newt Minion, MD;  Location: [facility];  Service: Orthopedics;  Laterality: Right;  Right Foot Excision Soft Tissue Mass  . OOPHORECTOMY  2002   DX LAP W/RSO  . OOPHORECTOMY  2000   TAH W LSO  . PELVIC LAPAROSCOPY     DL  . POLYPECTOMY    . TONSILLECTOMY      Family History  Problem Relation Age of Onset  . Diabetes Mother   . Hypertension Mother   . Lymphoma Father   . Cancer Father   . Diabetes Maternal Grandmother   . Hypertension Maternal Grandmother   . Heart disease Maternal Grandmother     Allergies  Allergen Reactions  . Codeine Nausea Only    Current Outpatient Prescriptions on File Prior to Visit  Medication Sig Dispense Refill  . albuterol (PROVENTIL HFA;VENTOLIN HFA) 108 (90 Base) MCG/ACT inhaler Inhale 2 puffs into the lungs every 4 (four) hours as needed for wheezing or shortness of breath. 1 Inhaler 0  . ALPRAZolam (XANAX) 0.5 MG tablet TAKE 1 TABLET 3 TIMES DAILY AS NEEDED 90 tablet 2  .  aspirin EC 81 MG tablet Take 81 mg by mouth every other day.    . Calcium Carbonate-Vitamin D (CALCIUM + D PO) Take 1 tablet by mouth every other day.     . escitalopram (LEXAPRO) 10 MG tablet TAKE 1 TABLET BY MOUTH AT BEDTIME (Patient taking differently: TAKE 112 TABLET BY MOUTH AT BEDTIME) 90 tablet 0   No current facility-administered medications on file prior to visit.     BP (!) 130/94 (BP Location: Left Arm, Patient Position: Sitting, Cuff Size: Normal)   Pulse 66   Temp 98.1 F (36.7 C) (Oral)   Ht 5' 6.5" (1.689 m)   Wt 103 lb 6.4 oz (46.9 kg)   SpO2 98%   BMI 16.44 kg/m     Review of Systems  Constitutional: Negative.  Negative for appetite change, fatigue, fever and unexpected weight change.  HENT: Negative for congestion, dental problem, ear pain, hearing loss, mouth sores, nosebleeds, rhinorrhea, sinus pressure, sore throat, tinnitus, trouble swallowing and voice change.   Eyes:  Negative for photophobia, pain, discharge, redness and visual disturbance.  Respiratory: Negative for cough, chest tightness and shortness of breath.   Cardiovascular: Negative for chest pain, palpitations and leg swelling.  Gastrointestinal: Negative for abdominal distention, abdominal pain, blood in stool, constipation, diarrhea, nausea, rectal pain and vomiting.  Genitourinary: Negative for difficulty urinating, dysuria, flank pain, frequency, genital sores, hematuria, menstrual problem, pelvic pain, urgency, vaginal bleeding, vaginal discharge and vaginal pain.  Musculoskeletal: Negative for arthralgias, back pain, gait problem, joint swelling and neck stiffness.  Skin: Negative for rash.       History of psoriasis  Neurological: Negative for dizziness, syncope, speech difficulty, weakness, light-headedness, numbness and headaches.  Hematological: Negative for adenopathy. Does not bruise/bleed easily.  Psychiatric/Behavioral: Negative for agitation, behavioral problems, dysphoric mood, self-injury and suicidal ideas. The patient is not nervous/anxious.        Objective:   Physical Exam  Constitutional: She is oriented to person, place, and time. She appears well-developed and well-nourished.  HENT:  Head: Normocephalic and atraumatic.  Right Ear: External ear normal.  Left Ear: External ear normal.  Mouth/Throat: Oropharynx is clear and moist.  Eyes: Conjunctivae and EOM are normal.  Neck: Normal range of motion. Neck supple. No JVD present. No thyromegaly present.  Cardiovascular: Normal rate, regular rhythm, normal heart sounds and intact distal pulses.   No murmur heard. Dorsalis pedis pulses full.  Posterior tibial pulses faint  Pulmonary/Chest: Effort normal and breath sounds normal. She has no wheezes. She has no rales.  Abdominal: Soft. Bowel sounds are normal. She exhibits no distension and no mass. There is no tenderness. There is no rebound and no guarding.    Musculoskeletal: Normal range of motion. She exhibits no edema or tenderness.  Neurological: She is alert and oriented to person, place, and time. She has normal reflexes. No cranial nerve deficit. She exhibits normal muscle tone. Coordination normal.  Skin: Skin is warm and dry. No rash noted.  Scattered psoriatic lesions  Psychiatric: She has a normal mood and affect. Her behavior is normal.          Assessment & Plan:   Preventive health exam History colonic polyps.  Needs follow-up colonoscopy this year Ongoing tobacco use.  Total smoking cessation encouraged  We'll check the screening lab Colonoscopy Annual mammogram We'll discuss annual low-dose chest CT for lung cancer  Screening (agrees)  Follow-up one year or as needed  [employer]

## 2016-04-22 LAB — COMPREHENSIVE METABOLIC PANEL
ALT: 8 U/L (ref 0–35)
AST: 20 U/L (ref 0–37)
Albumin: 4.1 g/dL (ref 3.5–5.2)
Alkaline Phosphatase: 72 U/L (ref 39–117)
BUN: 11 mg/dL (ref 6–23)
CO2: 29 mEq/L (ref 19–32)
Calcium: 9.6 mg/dL (ref 8.4–10.5)
Chloride: 102 mEq/L (ref 96–112)
Creatinine, Ser: 0.77 mg/dL (ref 0.40–1.20)
GFR: 81.88 mL/min (ref >60.00)
Glucose, Bld: 80 mg/dL (ref 70–99)
Potassium: 3.8 mEq/L (ref 3.5–5.1)
Sodium: 137 mEq/L (ref 135–145)
Total Bilirubin: 0.6 mg/dL (ref 0.2–1.2)
Total Protein: 6.2 g/dL (ref 6.0–8.3)

## 2016-04-22 LAB — CBC WITH DIFFERENTIAL/PLATELET
Basophils Absolute: 0.1 10*3/uL (ref 0.0–0.1)
Basophils Relative: 1.2 % (ref 0.0–3.0)
Eosinophils Absolute: 0.2 10*3/uL (ref 0.0–0.7)
Eosinophils Relative: 2.1 % (ref 0.0–5.0)
HCT: 39.3 % (ref 36.0–46.0)
Hemoglobin: 13.2 g/dL (ref 12.0–15.0)
Lymphocytes Relative: 44.2 % (ref 12.0–46.0)
Lymphs Abs: 3.3 10*3/uL (ref 0.7–4.0)
MCHC: 33.5 g/dL (ref 30.0–36.0)
MCV: 93 fl (ref 78.0–100.0)
Monocytes Absolute: 0.5 10*3/uL (ref 0.1–1.0)
Monocytes Relative: 7.3 % (ref 3.0–12.0)
Neutro Abs: 3.4 10*3/uL (ref 1.4–7.7)
Neutrophils Relative %: 45.2 % (ref 43.0–77.0)
Platelets: 216 10*3/uL (ref 150.0–400.0)
RBC: 4.23 Mil/uL (ref 3.87–5.11)
RDW: 13.6 % (ref 11.5–15.5)
WBC: 7.5 10*3/uL (ref 4.0–10.5)

## 2016-04-22 LAB — LIPID PANEL
Cholesterol: 170 mg/dL (ref 0–200)
HDL: 56.3 mg/dL (ref >39.00)
LDL Cholesterol: 91 mg/dL (ref 0–99)
NonHDL: 114.03
Total CHOL/HDL Ratio: 3
Triglycerides: 116 mg/dL (ref 0.0–149.0)
VLDL: 23.2 mg/dL (ref 0.0–40.0)

## 2016-04-22 LAB — TSH: TSH: 2.78 u[IU]/mL (ref 0.35–4.50)

## 2016-04-30 ENCOUNTER — Other Ambulatory Visit: Payer: Self-pay | Admitting: Acute Care

## 2016-04-30 DIAGNOSIS — F17210 Nicotine dependence, cigarettes, uncomplicated: Secondary | ICD-10-CM

## 2016-05-01 ENCOUNTER — Telehealth: Payer: Self-pay | Admitting: Internal Medicine

## 2016-05-01 NOTE — Telephone Encounter (Signed)
Pt req call back about lab results .. °

## 2016-05-02 NOTE — Telephone Encounter (Signed)
Please call/notify patient that lab/test/procedure is normal.  Cholesterol 170, which is the lowest value on record!!

## 2016-05-02 NOTE — Telephone Encounter (Signed)
Please advise 

## 2016-05-02 NOTE — Telephone Encounter (Signed)
Called and informed pt of lab results. Pt verbalized understanding and requested that a copy of her labs be mailed to her home.  Labs printed and placed to be mailed out today.

## 2016-05-05 ENCOUNTER — Ambulatory Visit (INDEPENDENT_AMBULATORY_CARE_PROVIDER_SITE_OTHER)
Admission: RE | Admit: 2016-05-05 | Discharge: 2016-05-05 | Disposition: A | Discharge status: Home / Self Care | Payer: Managed Care, Other (non HMO) | Source: Ambulatory Visit | Attending: Acute Care | Admitting: Acute Care

## 2016-05-05 ENCOUNTER — Encounter: Payer: Self-pay | Admitting: Acute Care

## 2016-05-05 ENCOUNTER — Ambulatory Visit (INDEPENDENT_AMBULATORY_CARE_PROVIDER_SITE_OTHER): Payer: Managed Care, Other (non HMO) | Admitting: Acute Care

## 2016-05-05 DIAGNOSIS — Z87891 Personal history of nicotine dependence: Secondary | ICD-10-CM | POA: Diagnosis not present

## 2016-05-05 DIAGNOSIS — F17210 Nicotine dependence, cigarettes, uncomplicated: Secondary | ICD-10-CM

## 2016-05-05 NOTE — Progress Notes (Signed)
Shared Decision Making Visit Lung Cancer Screening Program [phone])   Eligibility:  Age 56 y.o.  Pack Years Smoking History Calculation 41-pack-year smoking history (# packs/per year x # years smoked)  Recent History of coughing up blood  no  Unexplained weight loss? no ( >Than 15 pounds within the last 6 months )  Prior History Lung / other cancer no (Diagnosis within the last 5 years already requiring surveillance chest CT Scans).  Smoking Status Current Smoker  Former Smokers: Years since quit: NA  Quit Date: NA  Visit Components:  Discussion included one or more decision making aids. yes  Discussion included risk/benefits of screening. yes  Discussion included potential follow up diagnostic testing for abnormal scans. yes  Discussion included meaning and risk of over diagnosis. yes  Discussion included meaning and risk of False Positives. yes  Discussion included meaning of total radiation exposure. yes  Counseling Included:  Importance of adherence to annual lung cancer LDCT screening. yes  Impact of comorbidities on ability to participate in the program. yes  Ability and willingness to under diagnostic treatment. yes  Smoking Cessation Counseling:  Current Smokers:   Discussed importance of smoking cessation. yes  Information about tobacco cessation classes and interventions provided to patient. yes  Patient provided with "ticket" for LDCT Scan. yes  Symptomatic Patient. no  Counseling  Diagnosis Code: Tobacco Use Z72.0  Asymptomatic Patient yes  Counseling (Intermediate counseling: > three minutes counseling) U1324  Former Smokers:   Discussed the importance of maintaining cigarette abstinence. yes  Diagnosis Code: Personal History of Nicotine Dependence. M01.027  Information about tobacco cessation classes and interventions provided to patient. Yes  Patient provided with "ticket" for LDCT Scan. yes  Written Order for Lung Cancer  Screening with LDCT placed in Epic. Yes (CT Chest Lung Cancer Screening Low Dose W/O CM) [id number] Z12.2-Screening of respiratory organs Z87.891-Personal history of nicotine dependence  I have spent 25 minutes of face to face time with Susan Aguirre discussing the risks and benefits of lung cancer screening. We viewed a power point together that explained in detail the above noted topics. We paused at intervals to allow for questions to be asked and answered to ensure understanding.We discussed that the single most powerful action that she can take to decrease her risk of developing lung cancer is to quit smoking. We discussed whether or not she is ready to commit to setting a quit date. She is currently not ready to set a quit date. We discussed options for tools to aid in quitting smoking including nicotine replacement therapy, non-nicotine medications, support groups, Quit Smart classes, and behavior modification. We discussed that often times setting smaller, more achievable goals, such as eliminating 1 cigarette a day for a week and then 2 cigarettes a day for a week can be helpful in slowly decreasing the number of cigarettes smoked. This allows for a sense of accomplishment as well as providing a clinical benefit. I gave Susan Aguirre the " Be Stronger Than Your Excuses" card with contact information for community resources, classes, free nicotine replacement therapy, and access to mobile apps, text messaging, and on-line smoking cessation help. I have also given her my card and contact information in the event she needs to contact me. We discussed the time and location of the scan, and that either Doroteo Glassman RN or I will call with the results within 24-48 hours of receiving them. I have offered her  a copy of the power point we viewed  as  a resource in the event they need reinforcement of the concepts we discussed today in the office. The patient verbalized understanding of all of  the above and had no  further questions upon leaving the office. They have my contact information in the event they have any further questions.  I spent 4 minutes counseling on smoking cessation and the health risks of continued tobacco abuse.  I explained to the patient that there has been a high incidence of coronary artery disease noted on these exams. I explained that this is a non-gated exam therefore degree or severity cannot be determined. This patient is currently not on statin therapy. I have asked the patient to follow-up with their PCP regarding any incidental finding of coronary artery disease and management with diet or medication as their PCP  feels is clinically indicated. The patient verbalized understanding of the above and had no further questions upon completion of the visit.       Susan Spatz, NP 05/05/2016

## 2016-05-07 ENCOUNTER — Other Ambulatory Visit: Payer: Self-pay | Admitting: Acute Care

## 2016-05-07 DIAGNOSIS — F17210 Nicotine dependence, cigarettes, uncomplicated: Secondary | ICD-10-CM

## 2016-06-18 ENCOUNTER — Encounter: Payer: Self-pay | Admitting: Internal Medicine

## 2016-06-24 ENCOUNTER — Encounter (HOSPITAL_COMMUNITY): Payer: Self-pay | Admitting: *Deleted

## 2016-06-24 ENCOUNTER — Emergency Department (HOSPITAL_COMMUNITY): Payer: Managed Care, Other (non HMO)

## 2016-06-24 ENCOUNTER — Emergency Department (HOSPITAL_COMMUNITY)
Admission: EM | Admit: 2016-06-24 | Discharge: 2016-06-24 | Disposition: A | Discharge status: Home / Self Care | Payer: Managed Care, Other (non HMO) | Attending: Emergency Medicine | Admitting: Emergency Medicine

## 2016-06-24 DIAGNOSIS — Z79899 Other long term (current) drug therapy: Secondary | ICD-10-CM | POA: Insufficient documentation

## 2016-06-24 DIAGNOSIS — F17210 Nicotine dependence, cigarettes, uncomplicated: Secondary | ICD-10-CM | POA: Diagnosis not present

## 2016-06-24 DIAGNOSIS — J45909 Unspecified asthma, uncomplicated: Secondary | ICD-10-CM | POA: Insufficient documentation

## 2016-06-24 DIAGNOSIS — Z7982 Long term (current) use of aspirin: Secondary | ICD-10-CM | POA: Insufficient documentation

## 2016-06-24 DIAGNOSIS — R079 Chest pain, unspecified: Secondary | ICD-10-CM | POA: Diagnosis not present

## 2016-06-24 LAB — BASIC METABOLIC PANEL
Anion gap: 7 (ref 5–15)
BUN: 9 mg/dL (ref 6–20)
CO2: 26 mmol/L (ref 22–32)
Calcium: 9 mg/dL (ref 8.9–10.3)
Chloride: 101 mmol/L (ref 101–111)
Creatinine, Ser: 0.75 mg/dL (ref 0.44–1.00)
GFR calc Af Amer: >60 mL/min (ref >60)
GFR calc non Af Amer: >60 mL/min (ref >60)
Glucose, Bld: 110 mg/dL — ABNORMAL HIGH (ref 65–99)
Potassium: 3.4 mmol/L — ABNORMAL LOW (ref 3.5–5.1)
Sodium: 134 mmol/L — ABNORMAL LOW (ref 135–145)

## 2016-06-24 LAB — CBC
HCT: 39.1 % (ref 36.0–46.0)
Hemoglobin: 12.8 g/dL (ref 12.0–15.0)
MCH: 30.5 pg (ref 26.0–34.0)
MCHC: 32.7 g/dL (ref 30.0–36.0)
MCV: 93.1 fL (ref 78.0–100.0)
Platelets: 235 10*3/uL (ref 150–400)
RBC: 4.2 MIL/uL (ref 3.87–5.11)
RDW: 13.6 % (ref 11.5–15.5)
WBC: 9.4 10*3/uL (ref 4.0–10.5)

## 2016-06-24 LAB — I-STAT TROPONIN, ED: Troponin i, poc: 0 ng/mL (ref 0.00–0.08)

## 2016-06-24 NOTE — ED Triage Notes (Addendum)
Pt states that she has had left chest pain for 4 days. Pt states that pain is worse with movement and breathing. Pt states that pain radiates to her left arm. Pt reports hx of smoking. Pt reports that her BP has been elevated for the last 2 days.

## 2016-06-24 NOTE — ED Notes (Signed)
Shari PA at bedside   

## 2016-06-24 NOTE — ED Provider Notes (Signed)
[facility] DEPT Provider Note   CSN: [id number] Arrival date & time: 06/24/16  1450     History   Chief Complaint Chief Complaint  Patient presents with  . Chest Pain    HPI Susan Aguirre is a 56 y.o. female.  Patient with history of COPD, continuous smoker, anxiety presents with complaint of chest pain for the past 4 days. It is started as pressure in the left chest that has since progressed to left axilla and shoulder. It has been constant. The discomfort is worsened when she takes a deep breath. She denies SOB, cough or fever. No nausea or diaphoresis. The chest discomfort is not exertional. She is also concerned about her blood pressure which has been elevated for the past 4 days as well. No history of HTN.    The history is provided by the patient. No language interpreter was used.  Chest Pain   Pertinent negatives include no abdominal pain, no cough, no diaphoresis, no fever, no nausea, no shortness of breath, no vomiting and no weakness.    Past Medical History:  Diagnosis Date  . Anemia    "borderline"  . Anxiety   . Arthritis   . Asthma    "bronchial asthmatic, flares up seasonally"  . Calcification of aorta (HCC)    Abdominal aorta  . CIN I (cervical intraepithelial neoplasia I)   . Depression   . Diverticulosis   . Dysplastic colon polyp   . Endometriosis   . Hemorrhoid   . Psoriasis   . Seasonal allergies     Patient Active Problem List   Diagnosis Date Noted  . Adjustment disorder with mixed anxiety and depressed mood 02/21/2013  . Colonic polyp 02/21/2011  . CIN I (cervical intraepithelial neoplasia I)   . Hemorrhoid   . Endometriosis     Past Surgical History:  Procedure Laterality Date  . ABDOMINAL HYSTERECTOMY  2000   TAH,LSO (RSO IN 2002)  . APPENDECTOMY  1982   LYSIS OF BOWEL ADHESIONS  . COLON SURGERY     "small amount of colon taken at time of oophorectomy in 2002"  . COLONOSCOPY    . COLPOSCOPY    . GYNECOLOGIC  CRYOSURGERY    . LIPOMA EXCISION     removed from back and right foot"done at different times"  . MASS EXCISION  12/24/2011   Procedure: EXCISION MASS;  Surgeon: Newt Minion, MD;  Location: [place];  Service: Orthopedics;  Laterality: Right;  Right Foot Excision Soft Tissue Mass  . OOPHORECTOMY  2002   DX LAP W/RSO  . OOPHORECTOMY  2000   TAH W LSO  . PELVIC LAPAROSCOPY     DL  . POLYPECTOMY    . TONSILLECTOMY      OB History    Gravida Para Term Preterm AB Living   1       1 0   SAB TAB Ectopic Multiple Live Births                   Home Medications    Prior to Admission medications   Medication Sig Start Date End Date Taking? Authorizing Provider  albuterol (PROVENTIL HFA;VENTOLIN HFA) 108 (90 Base) MCG/ACT inhaler Inhale 2 puffs into the lungs every 4 (four) hours as needed for wheezing or shortness of breath. 01/20/15  Yes Mabe, Shanon Brow, NP  ALPRAZolam Duanne Moron) 0.5 MG tablet Take 1 tablet (0.5 mg total) by mouth 3 (three) times daily as needed. 04/21/16  Yes Bluford Kaufmann  F, MD  aspirin EC 81 MG tablet Take 81 mg by mouth every other day.   Yes [provider]  Calcium Carbonate-Vitamin D (CALCIUM + D PO) Take 1 tablet by mouth every other day.    Yes [provider]  Colloidal Oatmeal (GOLD BOND ECZEMA RELIEF EX) Apply 1 application topically as needed (ECZEMA).   Yes [provider]  escitalopram (LEXAPRO) 10 MG tablet TAKE 1 TABLET BY MOUTH AT BEDTIME Patient taking differently: TAKE 5 mg table a bedtime 03/03/16  Yes Marletta Lor, MD  hydrocortisone 2.5 % cream Apply 1 application topically as needed.   Yes [provider]    Family History Family History  Problem Relation Age of Onset  . Diabetes Mother   . Hypertension Mother   . Lymphoma Father   . Cancer Father   . Diabetes Maternal Grandmother   . Hypertension Maternal Grandmother   . Heart disease Maternal Grandmother     Social History Social History    Substance Use Topics  . Smoking status: Current Every Day Smoker    Packs/day: 0.75    Types: Cigarettes  . Smokeless tobacco: Never Used  . Alcohol use 3.0 oz/week    5 Cans of beer per week     Comment: "social"     Allergies   Codeine   Review of Systems Review of Systems  Constitutional: Negative for diaphoresis and fever.  HENT: Negative.   Respiratory: Negative.  Negative for cough and shortness of breath.   Cardiovascular: Positive for chest pain. Negative for leg swelling.  Gastrointestinal: Negative.  Negative for abdominal pain, nausea and vomiting.  Musculoskeletal: Negative.   Skin: Negative.   Neurological: Negative.  Negative for weakness.     Physical Exam Updated Vital Signs BP (!) 162/97   Pulse (!) 51   Temp 98 F (36.7 C) (Oral)   Resp 15   SpO2 100%   Physical Exam  Constitutional: She is oriented to person, place, and time. She appears well-developed and well-nourished.  HENT:  Head: Normocephalic.  Neck: Normal range of motion. Neck supple.  Cardiovascular: Normal rate and regular rhythm.   No murmur heard. Pulmonary/Chest: Effort normal and breath sounds normal. She has no wheezes. She has no rales. She exhibits no tenderness.  Abdominal: Soft. Bowel sounds are normal. There is no tenderness. There is no rebound and no guarding.  Musculoskeletal: Normal range of motion. She exhibits no edema.  Neurological: She is alert and oriented to person, place, and time.  Skin: Skin is warm and dry. No rash noted.  Psychiatric: She has a normal mood and affect.     ED Treatments / Results  Labs (all labs ordered are listed, but only abnormal results are displayed) Labs Reviewed  BASIC METABOLIC PANEL - Abnormal; Notable for the following:       Result Value   Sodium 134 (*)    Potassium 3.4 (*)    Glucose, Bld 110 (*)    All other components within normal limits  CBC  I-STAT TROPOININ, ED   Results for orders placed or performed during  the hospital encounter of 40/98/11  Basic metabolic panel  Result Value Ref Range   Sodium 134 (L) 135 - 145 mmol/L   Potassium 3.4 (L) 3.5 - 5.1 mmol/L   Chloride 101 101 - 111 mmol/L   CO2 26 22 - 32 mmol/L   Glucose, Bld 110 (H) 65 - 99 mg/dL   BUN 9 6 -  20 mg/dL   Creatinine, Ser 0.75 0.44 - 1.00 mg/dL   Calcium 9.0 8.9 - 10.3 mg/dL   GFR calc non Af Amer >60 >60 mL/min   GFR calc Af Amer >60 >60 mL/min   Anion gap 7 5 - 15  CBC  Result Value Ref Range   WBC 9.4 4.0 - 10.5 K/uL   RBC 4.20 3.87 - 5.11 MIL/uL   Hemoglobin 12.8 12.0 - 15.0 g/dL   HCT 39.1 36.0 - 46.0 %   MCV 93.1 78.0 - 100.0 fL   MCH 30.5 26.0 - 34.0 pg   MCHC 32.7 30.0 - 36.0 g/dL   RDW 13.6 11.5 - 15.5 %   Platelets 235 150 - 400 K/uL  I-stat troponin, ED  Result Value Ref Range   Troponin i, poc 0.00 0.00 - 0.08 ng/mL   Comment 3             EKG  EKG Interpretation  Date/Time:  Tuesday June 24 2016 14:54:30 EDT Ventricular Rate:  73 PR Interval:  136 QRS Duration: 92 QT Interval:  380 QTC Calculation: 418 R Axis:   93 Text Interpretation:  Normal sinus rhythm Biatrial enlargement Rightward axis Pulmonary disease pattern Incomplete right bundle branch block since last tracing no significant change Reconfirmed by Daleen Bo [phone]) on 06/24/2016 9:03:04 PM       Radiology Dg Chest 2 View  Result Date: 06/24/2016 CLINICAL DATA:  Left-sided chest pain and shortness of breath for several days EXAM: CHEST  2 VIEW COMPARISON:  05/05/2016 FINDINGS: Cardiac shadow is within normal limits. The lungs are hyperinflated consistent with COPD. No focal infiltrate or sizable effusion is noted. Mild lingular scarring is seen. No bony abnormality is noted. IMPRESSION: COPD without acute abnormality. Electronically Signed   By: Inez Catalina M.D.   On: 06/24/2016 15:50    Procedures Procedures (including critical care time)  Medications Ordered in ED Medications - No data to display   Initial  Impression / Assessment and Plan / ED Course  I have reviewed the triage vital signs and the nursing notes.  Pertinent labs & imaging results that were available during my care of the patient were reviewed by me and considered in my medical decision making (see chart for details).  Clinical Course as of Jun 24 2113  Tue Jun 24, 2016  2101 ED EKG within 10 minutes [EW]    Clinical Course User Index [EW] Daleen Bo, MD    Patient presents with chest pain x 4 days, constant, without SOB or nausea. No history of heart disease. Heart score of 1.   EKG, labs, are essentially normal. CXR showing COPD only. Discussed discharge home and close follow up with PCP for further management.   Final Clinical Impressions(s) / ED Diagnoses   Final diagnoses:  None   1. Nonspecific chest pain  New Prescriptions New Prescriptions   No medications on file     Dennie Bible 06/24/16 2155    Daleen Bo, MD 06/24/16 2318

## 2016-07-25 ENCOUNTER — Other Ambulatory Visit: Payer: Self-pay | Admitting: Internal Medicine

## 2016-10-30 ENCOUNTER — Other Ambulatory Visit: Payer: Self-pay | Admitting: Internal Medicine

## 2016-11-03 ENCOUNTER — Encounter: Payer: Self-pay | Admitting: Internal Medicine

## 2016-11-04 ENCOUNTER — Ambulatory Visit (INDEPENDENT_AMBULATORY_CARE_PROVIDER_SITE_OTHER): Payer: 59 | Admitting: Internal Medicine

## 2016-11-04 ENCOUNTER — Encounter: Payer: Self-pay | Admitting: Internal Medicine

## 2016-11-04 VITALS — BP 136/80 | HR 71 | Temp 97.7°F | Ht 66.5 in | Wt 101.4 lb

## 2016-11-04 DIAGNOSIS — I2510 Atherosclerotic heart disease of native coronary artery without angina pectoris: Secondary | ICD-10-CM | POA: Diagnosis not present

## 2016-11-04 DIAGNOSIS — Z720 Tobacco use: Secondary | ICD-10-CM | POA: Diagnosis not present

## 2016-11-04 DIAGNOSIS — F4323 Adjustment disorder with mixed anxiety and depressed mood: Secondary | ICD-10-CM

## 2016-11-04 DIAGNOSIS — I2584 Coronary atherosclerosis due to calcified coronary lesion: Secondary | ICD-10-CM | POA: Diagnosis not present

## 2016-11-04 DIAGNOSIS — Z23 Encounter for immunization: Secondary | ICD-10-CM | POA: Diagnosis not present

## 2016-11-04 MED ORDER — ALPRAZOLAM 0.5 MG PO TABS: 0.5000 mg | ORAL_TABLET | Freq: Three times a day (TID) | ORAL | 0 refills | Status: DC | PRN

## 2016-11-04 MED ORDER — ESCITALOPRAM OXALATE 10 MG PO TABS: 10.0000 mg | ORAL_TABLET | Freq: Every day | ORAL | 0 refills | Status: DC

## 2016-11-04 NOTE — Patient Instructions (Signed)
Smoking tobacco is very bad for your health. You should stop smoking immediately.  Return in 6 months for follow-up

## 2016-11-04 NOTE — Progress Notes (Signed)
Subjective:    Patient ID: Susan Aguirre, female    DOB: [birth year], 58 y.o.   MRN: [id number]  HPI 56 year old patient who is seen today for her biannual follow-up.  She was seen in the spring and did have a low-dose chest CT lung cancer screening evaluation.  This did reveal some coronary artery calcification involving the RCA. She continues to smoke tobacco She has a history of adjustment disorder with mixed anxiety and depressed mood.  She remains on alprazolam and low-dose Lexapro  Statin therapy discussed Smoking cessation and risk factor modification discussed  Past Medical History:  Diagnosis Date  . Anemia    "borderline"  . Anxiety   . Arthritis   . Asthma    "bronchial asthmatic, flares up seasonally"  . Calcification of aorta (HCC)    Abdominal aorta  . CIN I (cervical intraepithelial neoplasia I)   . Depression   . Diverticulosis   . Dysplastic colon polyp   . Endometriosis   . Hemorrhoid   . Psoriasis   . Seasonal allergies      Social History   Social History  . Marital status: Married    Spouse name: N/A  . Number of children: N/A  . Years of education: N/A   Occupational History  . Not on file.   Social History Main Topics  . Smoking status: Current Every Day Smoker    Packs/day: 0.75    Types: Cigarettes  . Smokeless tobacco: Never Used  . Alcohol use 3.0 oz/week    5 Cans of beer per week     Comment: "social"  . Drug use: No  . Sexual activity: Yes    Birth control/ protection: Surgical   Other Topics Concern  . Not on file   Social History Narrative  . No narrative on file    Past Surgical History:  Procedure Laterality Date  . ABDOMINAL HYSTERECTOMY  2000   TAH,LSO (RSO IN 2002)  . APPENDECTOMY  1982   LYSIS OF BOWEL ADHESIONS  . COLON SURGERY     "small amount of colon taken at time of oophorectomy in 2002"  . COLONOSCOPY    . COLPOSCOPY    . GYNECOLOGIC CRYOSURGERY    . LIPOMA EXCISION     removed from back  and right foot"done at different times"  . MASS EXCISION  12/24/2011   Procedure: EXCISION MASS;  Surgeon: Newt Minion, MD;  Location: [facility];  Service: Orthopedics;  Laterality: Right;  Right Foot Excision Soft Tissue Mass  . OOPHORECTOMY  2002   DX LAP W/RSO  . OOPHORECTOMY  2000   TAH W LSO  . PELVIC LAPAROSCOPY     DL  . POLYPECTOMY    . TONSILLECTOMY      Family History  Problem Relation Age of Onset  . Diabetes Mother   . Hypertension Mother   . Lymphoma Father   . Cancer Father   . Diabetes Maternal Grandmother   . Hypertension Maternal Grandmother   . Heart disease Maternal Grandmother     Allergies  Allergen Reactions  . Codeine Nausea Only    Current Outpatient Prescriptions on File Prior to Visit  Medication Sig Dispense Refill  . albuterol (PROVENTIL HFA;VENTOLIN HFA) 108 (90 Base) MCG/ACT inhaler Inhale 2 puffs into the lungs every 4 (four) hours as needed for wheezing or shortness of breath. 1 Inhaler 0  . aspirin EC 81 MG tablet Take 81 mg by mouth every other day.    Marland Kitchen  Calcium Carbonate-Vitamin D (CALCIUM + D PO) Take 1 tablet by mouth every other day.     . hydrocortisone 2.5 % cream Apply 1 application topically as needed.     No current facility-administered medications on file prior to visit.     BP 136/80 (BP Location: Left Arm, Patient Position: Sitting, Cuff Size: Normal)   Pulse 71   Temp 97.7 F (36.5 C) (Oral)   Ht 5' 6.5" (1.689 m)   Wt 101 lb 6.4 oz (46 kg)   SpO2 94%   BMI 16.12 kg/m      Review of Systems  Constitutional: Negative.   HENT: Negative for congestion, dental problem, hearing loss, rhinorrhea, sinus pressure, sore throat and tinnitus.   Eyes: Negative for pain, discharge and visual disturbance.  Respiratory: Negative for cough and shortness of breath.   Cardiovascular: Negative for chest pain, palpitations and leg swelling.  Gastrointestinal: Negative for abdominal distention, abdominal pain, blood in stool,  constipation, diarrhea, nausea and vomiting.  Genitourinary: Negative for difficulty urinating, dysuria, flank pain, frequency, hematuria, pelvic pain, urgency, vaginal bleeding, vaginal discharge and vaginal pain.  Musculoskeletal: Negative for arthralgias, gait problem and joint swelling.  Skin: Negative for rash.  Neurological: Negative for dizziness, syncope, speech difficulty, weakness, numbness and headaches.  Hematological: Negative for adenopathy.  Psychiatric/Behavioral: Negative for agitation, behavioral problems and dysphoric mood. The patient is nervous/anxious.        Objective:   Physical Exam  Constitutional: She is oriented to person, place, and time. She appears well-developed and well-nourished.  HENT:  Head: Normocephalic.  Right Ear: External ear normal.  Left Ear: External ear normal.  Mouth/Throat: Oropharynx is clear and moist.  Eyes: Pupils are equal, round, and reactive to light. Conjunctivae and EOM are normal.  Neck: Normal range of motion. Neck supple. No thyromegaly present.  Cardiovascular: Normal rate, regular rhythm, normal heart sounds and intact distal pulses.   Pulmonary/Chest: Effort normal and breath sounds normal.  Abdominal: Soft. Bowel sounds are normal. She exhibits no mass. There is no tenderness.  Musculoskeletal: Normal range of motion.  Lymphadenopathy:    She has no cervical adenopathy.  Neurological: She is alert and oriented to person, place, and time.  Skin: Skin is warm and dry. No rash noted.  Psychiatric: She has a normal mood and affect. Her behavior is normal.          Assessment & Plan:   Calcification of the right coronary artery.  Risk factor modification discussed including statin therapy and smoking cessation. Adjustment disorder with mixed anxiety and depressed mood stable.  No change in medical regimen  CPX 6 months  Tabius Rood Pilar Plate

## 2016-11-06 ENCOUNTER — Telehealth: Payer: Self-pay | Admitting: Internal Medicine

## 2016-11-06 NOTE — Telephone Encounter (Signed)
Please advise 

## 2016-11-06 NOTE — Telephone Encounter (Addendum)
Pt states she is ready to go on cholesterol medicine per discussion at visit on 10/30.   [id number] - [place], [place] - [address]  Pt would like a call back when sent in.

## 2016-11-12 MED ORDER — ATORVASTATIN CALCIUM 20 MG PO TABS: 20.0000 mg | ORAL_TABLET | Freq: Every day | ORAL | 2 refills | Status: DC

## 2016-11-12 NOTE — Telephone Encounter (Signed)
Please call in a new prescription for atorvastatin 20 mg #90 refill x3 and make patient aware

## 2016-11-12 NOTE — Telephone Encounter (Signed)
New prescription for atorvastatin 20 mg #90 refill x3 was called into [facility] and patient was made aware

## 2016-12-24 ENCOUNTER — Encounter: Payer: Self-pay | Admitting: Internal Medicine

## 2017-01-13 ENCOUNTER — Other Ambulatory Visit: Payer: Self-pay | Admitting: Internal Medicine

## 2017-01-14 NOTE — Telephone Encounter (Signed)
Last filled on 11/04/16 for #90 and seen the same day. No future follow up scheduled.  Please advise.  Thanks!!

## 2017-01-15 NOTE — Telephone Encounter (Signed)
Called to the pharmacy and left on machine. 

## 2017-01-15 NOTE — Telephone Encounter (Signed)
Okay for refill?  

## 2017-02-05 ENCOUNTER — Other Ambulatory Visit: Payer: Self-pay | Admitting: Internal Medicine

## 2017-02-15 ENCOUNTER — Other Ambulatory Visit: Payer: Self-pay | Admitting: Internal Medicine

## 2017-02-19 ENCOUNTER — Other Ambulatory Visit: Payer: Self-pay | Admitting: Internal Medicine

## 2017-02-19 ENCOUNTER — Telehealth: Payer: Self-pay

## 2017-02-19 NOTE — Telephone Encounter (Signed)
Rx for AlpRaZolam 0.5 mg was phoned in to pt phamacy for refill, #90 with no refills.

## 2017-03-25 ENCOUNTER — Other Ambulatory Visit: Payer: Self-pay | Admitting: Internal Medicine

## 2017-04-28 ENCOUNTER — Other Ambulatory Visit: Payer: Self-pay | Admitting: Internal Medicine

## 2017-04-29 ENCOUNTER — Other Ambulatory Visit: Payer: Self-pay | Admitting: Internal Medicine

## 2017-05-07 ENCOUNTER — Inpatient Hospital Stay: Admission: RE | Admit: 2017-05-07 | Payer: Self-pay | Source: Ambulatory Visit

## 2017-05-07 ENCOUNTER — Ambulatory Visit (INDEPENDENT_AMBULATORY_CARE_PROVIDER_SITE_OTHER)
Admission: RE | Admit: 2017-05-07 | Discharge: 2017-05-07 | Disposition: A | Discharge status: Home / Self Care | Payer: 59 | Source: Ambulatory Visit | Attending: Acute Care | Admitting: Acute Care

## 2017-05-07 DIAGNOSIS — F17210 Nicotine dependence, cigarettes, uncomplicated: Secondary | ICD-10-CM

## 2017-05-22 ENCOUNTER — Other Ambulatory Visit: Payer: Self-pay | Admitting: Acute Care

## 2017-05-22 DIAGNOSIS — Z122 Encounter for screening for malignant neoplasm of respiratory organs: Secondary | ICD-10-CM

## 2017-05-22 DIAGNOSIS — F17210 Nicotine dependence, cigarettes, uncomplicated: Secondary | ICD-10-CM

## 2017-06-05 ENCOUNTER — Telehealth: Payer: Self-pay | Admitting: Internal Medicine

## 2017-06-11 ENCOUNTER — Other Ambulatory Visit: Payer: Self-pay | Admitting: Internal Medicine

## 2017-06-11 NOTE — Telephone Encounter (Signed)
Pt is out of her medication and has an appt on 06/17/17. She is hoping a refill can be called in. She was not aware she needed to make an appt with Dr. Raliegh Ip to get a refill

## 2017-06-11 NOTE — Telephone Encounter (Signed)
Spoke with pt and advised her she is 2 months past her recommended follow up. Also advised her last rx did have note that she would need OV for additional refills. Pt says she was not aware. Pt is scheduled for OV 06/17/17 but is out of medication now. She would like to know if bridge rx can be sent in. Advised pt Dr. Raliegh Ip is not back in office until Monday.   Dr. Raliegh Ip - Please advise. Thanks!

## 2017-06-12 NOTE — Telephone Encounter (Signed)
Pt made an appointment for more refills.

## 2017-06-15 MED ORDER — ALPRAZOLAM 0.5 MG PO TABS: 0.5000 mg | ORAL_TABLET | Freq: Three times a day (TID) | ORAL | 0 refills | Status: DC

## 2017-06-15 NOTE — Telephone Encounter (Signed)
OK #60

## 2017-06-15 NOTE — Telephone Encounter (Signed)
Rx called in to pharmacy. Spoke with pt and advised. Nothing further needed.

## 2017-06-15 NOTE — Addendum Note (Signed)
Addended by: Wynn Banker H on: 06/15/2017 12:45 PM   Modules accepted: Orders

## 2017-06-17 ENCOUNTER — Ambulatory Visit (INDEPENDENT_AMBULATORY_CARE_PROVIDER_SITE_OTHER): Payer: 59 | Admitting: Internal Medicine

## 2017-06-17 ENCOUNTER — Encounter: Payer: Self-pay | Admitting: Internal Medicine

## 2017-06-17 VITALS — BP 120/90 | Temp 97.8°F | Wt 100.2 lb

## 2017-06-17 DIAGNOSIS — M25552 Pain in left hip: Secondary | ICD-10-CM

## 2017-06-17 DIAGNOSIS — F4323 Adjustment disorder with mixed anxiety and depressed mood: Secondary | ICD-10-CM

## 2017-06-17 DIAGNOSIS — R69 Illness, unspecified: Secondary | ICD-10-CM | POA: Diagnosis not present

## 2017-06-17 MED ORDER — METHYLPREDNISOLONE ACETATE 40 MG/ML IJ SUSP
40.0000 mg | Freq: Once | INTRAMUSCULAR | Status: AC
  Administered 2017-06-17: 40 mg via INTRAMUSCULAR

## 2017-06-17 MED ORDER — ESCITALOPRAM OXALATE 10 MG PO TABS: ORAL_TABLET | ORAL | 4 refills | Status: DC

## 2017-06-17 MED ORDER — ALPRAZOLAM 0.5 MG PO TABS
0.5000 mg | ORAL_TABLET | Freq: Three times a day (TID) | ORAL | 0 refills | Status: DC
Start: 2017-06-17 — End: 2017-08-18

## 2017-06-17 MED ORDER — ALPRAZOLAM 0.5 MG PO TABS: 0.5000 mg | ORAL_TABLET | Freq: Three times a day (TID) | ORAL | 0 refills | Status: DC

## 2017-06-17 MED ORDER — ATORVASTATIN CALCIUM 20 MG PO TABS: 20.0000 mg | ORAL_TABLET | Freq: Every day | ORAL | 2 refills | Status: DC

## 2017-06-17 NOTE — Patient Instructions (Signed)
Return in 6 months for follow up

## 2017-06-17 NOTE — Addendum Note (Signed)
Addended by: Gwenyth Ober R on: 06/17/2017 01:42 PM   Modules accepted: Orders

## 2017-06-17 NOTE — Progress Notes (Signed)
Subjective:    Patient ID: Susan Aguirre, female    DOB: Aug 09, 1960, 57 y.o.   MRN: [id number]  HPI 57 year old patient who is seen today in follow-up She has a history of ongoing tobacco use.  She has had a recent follow-up low-dose chest CT scan. She is on statin therapy due to coronary artery and generalized atherosclerotic changes noted on imaging studies She has a history of anxiety disorder which has been managed with alprazolam as well as Lexapro.  She was initiated on this regimen by psychiatry in the past at the present time she remains on alprazolam 3 times daily.  Past Medical History:  Diagnosis Date  . Anemia    "borderline"  . Anxiety   . Arthritis   . Asthma    "bronchial asthmatic, flares up seasonally"  . Calcification of aorta (HCC)    Abdominal aorta  . CIN I (cervical intraepithelial neoplasia I)   . Depression   . Diverticulosis   . Dysplastic colon polyp   . Endometriosis   . Hemorrhoid   . Psoriasis   . Seasonal allergies      Social History   Socioeconomic History  . Marital status: Married    Spouse name: Not on file  . Number of children: Not on file  . Years of education: Not on file  . Highest education level: Not on file  Occupational History  . Not on file  Social Needs  . Financial resource strain: Not on file  . Food insecurity:    Worry: Not on file    Inability: Not on file  . Transportation needs:    Medical: Not on file    Non-medical: Not on file  Tobacco Use  . Smoking status: Current Every Day Smoker    Packs/day: 0.75    Types: Cigarettes  . Smokeless tobacco: Never Used  Substance and Sexual Activity  . Alcohol use: Yes    Alcohol/week: 3.0 oz    Types: 5 Cans of beer per week    Comment: "social"  . Drug use: No  . Sexual activity: Yes    Birth control/protection: Surgical  Lifestyle  . Physical activity:    Days per week: Not on file    Minutes per session: Not on file  . Stress: Not on file    Relationships  . Social connections:    Talks on phone: Not on file    Gets together: Not on file    Attends religious service: Not on file    Active member of club or organization: Not on file    Attends meetings of clubs or organizations: Not on file    Relationship status: Not on file  . Intimate partner violence:    Fear of current or ex partner: Not on file    Emotionally abused: Not on file    Physically abused: Not on file    Forced sexual activity: Not on file  Other Topics Concern  . Not on file  Social History Narrative  . Not on file    Past Surgical History:  Procedure Laterality Date  . ABDOMINAL HYSTERECTOMY  2000   TAH,LSO (RSO IN 2002)  . APPENDECTOMY  1982   LYSIS OF BOWEL ADHESIONS  . COLON SURGERY     "small amount of colon taken at time of oophorectomy in 2002"  . COLONOSCOPY    . COLPOSCOPY    . GYNECOLOGIC CRYOSURGERY    . LIPOMA EXCISION  removed from back and right foot"done at different times"  . MASS EXCISION  12/24/2011   Procedure: EXCISION MASS;  Surgeon: Newt Minion, MD;  Location: [facility];  Service: Orthopedics;  Laterality: Right;  Right Foot Excision Soft Tissue Mass  . OOPHORECTOMY  2002   DX LAP W/RSO  . OOPHORECTOMY  2000   TAH W LSO  . PELVIC LAPAROSCOPY     DL  . POLYPECTOMY    . TONSILLECTOMY      Family History  Problem Relation Age of Onset  . Diabetes Mother   . Hypertension Mother   . Lymphoma Father   . Cancer Father   . Diabetes Maternal Grandmother   . Hypertension Maternal Grandmother   . Heart disease Maternal Grandmother     Allergies  Allergen Reactions  . Codeine Nausea Only    Current Outpatient Medications on File Prior to Visit  Medication Sig Dispense Refill  . albuterol (PROVENTIL HFA;VENTOLIN HFA) 108 (90 Base) MCG/ACT inhaler Inhale 2 puffs into the lungs every 4 (four) hours as needed for wheezing or shortness of breath. 1 Inhaler 0  . aspirin EC 81 MG tablet Take 81 mg by mouth every other  day.    . Calcium Carbonate-Vitamin D (CALCIUM + D PO) Take 1 tablet by mouth every other day.     . hydrocortisone 2.5 % cream Apply 1 application topically as needed.     No current facility-administered medications on file prior to visit.     BP 120/90 (BP Location: Left Arm, Patient Position: Sitting, Cuff Size: Normal)   Temp 97.8 F (36.6 C) (Oral)   Wt 100 lb 3.2 oz (45.5 kg)   BMI 15.93 kg/m      Review of Systems  Constitutional: Negative.   HENT: Negative for congestion, dental problem, hearing loss, rhinorrhea, sinus pressure, sore throat and tinnitus.   Eyes: Negative for pain, discharge and visual disturbance.  Respiratory: Negative for cough and shortness of breath.   Cardiovascular: Negative for chest pain, palpitations and leg swelling.  Gastrointestinal: Negative for abdominal distention, abdominal pain, blood in stool, constipation, diarrhea, nausea and vomiting.  Genitourinary: Negative for difficulty urinating, dysuria, flank pain, frequency, hematuria, pelvic pain, urgency, vaginal bleeding, vaginal discharge and vaginal pain.  Musculoskeletal: Negative for arthralgias, gait problem and joint swelling.  Skin: Negative for rash.  Neurological: Negative for dizziness, syncope, speech difficulty, weakness, numbness and headaches.  Hematological: Negative for adenopathy.  Psychiatric/Behavioral: Negative for agitation, behavioral problems and dysphoric mood. The patient is nervous/anxious.        Objective:   Physical Exam  Constitutional: She is oriented to person, place, and time. She appears well-developed and well-nourished.  HENT:  Head: Normocephalic.  Right Ear: External ear normal.  Left Ear: External ear normal.  Mouth/Throat: Oropharynx is clear and moist.  Eyes: Pupils are equal, round, and reactive to light. Conjunctivae and EOM are normal.  Neck: Normal range of motion. Neck supple. No thyromegaly present.  Cardiovascular: Normal rate, regular  rhythm, normal heart sounds and intact distal pulses.  Pulmonary/Chest: Effort normal and breath sounds normal.  Abdominal: Soft. Bowel sounds are normal. She exhibits no mass. There is no tenderness.  Musculoskeletal: Normal range of motion.  Lymphadenopathy:    She has no cervical adenopathy.  Neurological: She is alert and oriented to person, place, and time.  Skin: Skin is warm and dry. No rash noted.  Psychiatric: She has a normal mood and affect. Her behavior is normal.  Assessment & Plan:  Anxiety disorder.  Patient will attempt to moderate her alprazolam use and attempt to cut down to a twice daily regimen.  Continue Lexapro.  Medications updated   ongoing tobacco use.  Smoking cessation discussed  Coronary artery calcification.  Continue statin therapy  Marletta Lor

## 2017-08-12 DIAGNOSIS — Z0100 Encounter for examination of eyes and vision without abnormal findings: Secondary | ICD-10-CM | POA: Diagnosis not present

## 2017-08-12 DIAGNOSIS — H524 Presbyopia: Secondary | ICD-10-CM | POA: Diagnosis not present

## 2017-08-18 ENCOUNTER — Other Ambulatory Visit: Payer: Self-pay | Admitting: Internal Medicine

## 2017-09-08 DIAGNOSIS — L218 Other seborrheic dermatitis: Secondary | ICD-10-CM | POA: Diagnosis not present

## 2017-09-08 DIAGNOSIS — L820 Inflamed seborrheic keratosis: Secondary | ICD-10-CM | POA: Diagnosis not present

## 2017-09-08 DIAGNOSIS — L84 Corns and callosities: Secondary | ICD-10-CM | POA: Diagnosis not present

## 2017-09-08 DIAGNOSIS — L400 Psoriasis vulgaris: Secondary | ICD-10-CM | POA: Diagnosis not present

## 2017-09-08 DIAGNOSIS — D225 Melanocytic nevi of trunk: Secondary | ICD-10-CM | POA: Diagnosis not present

## 2017-09-08 DIAGNOSIS — R208 Other disturbances of skin sensation: Secondary | ICD-10-CM | POA: Diagnosis not present

## 2017-09-09 ENCOUNTER — Encounter: Payer: Self-pay | Admitting: Internal Medicine

## 2017-09-23 ENCOUNTER — Telehealth: Payer: Self-pay | Admitting: Internal Medicine

## 2017-09-23 NOTE — Telephone Encounter (Signed)
Pt is almost out of medication

## 2017-09-23 NOTE — Telephone Encounter (Signed)
Last refill 08/20/17 Last office visit 06/17/17

## 2017-10-06 DIAGNOSIS — R0981 Nasal congestion: Secondary | ICD-10-CM

## 2017-10-06 HISTORY — DX: Nasal congestion: R09.81

## 2017-10-14 ENCOUNTER — Ambulatory Visit (AMBULATORY_SURGERY_CENTER): Payer: Self-pay | Admitting: *Deleted

## 2017-10-14 ENCOUNTER — Encounter: Payer: Self-pay | Admitting: Internal Medicine

## 2017-10-14 VITALS — Ht 66.0 in | Wt 100.0 lb

## 2017-10-14 DIAGNOSIS — Z86010 Personal history of colonic polyps: Secondary | ICD-10-CM

## 2017-10-14 MED ORDER — NA SULFATE-K SULFATE-MG SULF 17.5-3.13-1.6 GM/177ML PO SOLN: 1.0000 | Freq: Once | ORAL | 0 refills | Status: AC

## 2017-10-14 NOTE — Progress Notes (Signed)
No egg or soy allergy known to patient  No issues with past sedation with any surgeries  or procedures, no intubation problems  No diet pills per patient No home 02 use per patient  No blood thinners per patient  Pt denies issues with constipation  No A fib or A flutter  EMMI video sent to pt's e mail pt declined   

## 2017-10-21 ENCOUNTER — Encounter: Payer: Self-pay | Admitting: Internal Medicine

## 2017-10-21 ENCOUNTER — Ambulatory Visit (AMBULATORY_SURGERY_CENTER): Payer: 59 | Admitting: Internal Medicine

## 2017-10-21 VITALS — BP 138/85 | HR 66 | Temp 98.0°F | Resp 22 | Ht 66.0 in | Wt 100.0 lb

## 2017-10-21 DIAGNOSIS — D123 Benign neoplasm of transverse colon: Secondary | ICD-10-CM | POA: Diagnosis not present

## 2017-10-21 DIAGNOSIS — D124 Benign neoplasm of descending colon: Secondary | ICD-10-CM | POA: Diagnosis not present

## 2017-10-21 DIAGNOSIS — Z1211 Encounter for screening for malignant neoplasm of colon: Secondary | ICD-10-CM

## 2017-10-21 DIAGNOSIS — Z86010 Personal history of colonic polyps: Secondary | ICD-10-CM

## 2017-10-21 MED ORDER — SODIUM CHLORIDE 0.9 % IV SOLN: 500.0000 mL | Freq: Once | INTRAVENOUS | Status: DC

## 2017-10-21 NOTE — Progress Notes (Signed)
Pt's states no medical or surgical changes since previsit or office visit. 

## 2017-10-21 NOTE — Patient Instructions (Signed)
YOU HAD AN ENDOSCOPIC PROCEDURE TODAY AT [facility]:   Refer to the procedure report that was given to you for any specific questions about what was found during the examination.  If the procedure report does not answer your questions, please call your gastroenterologist to clarify.  If you requested that your care partner not be given the details of your procedure findings, then the procedure report has been included in a sealed envelope for you to review at your convenience later.  YOU SHOULD EXPECT: Some feelings of bloating in the abdomen. Passage of more gas than usual.  Walking can help get rid of the air that was put into your GI tract during the procedure and reduce the bloating. If you had a lower endoscopy (such as a colonoscopy or flexible sigmoidoscopy) you may notice spotting of blood in your stool or on the toilet paper. If you underwent a bowel prep for your procedure, you may not have a normal bowel movement for a few days.  Please Note:  You might notice some irritation and congestion in your nose or some drainage.  This is from the oxygen used during your procedure.  There is no need for concern and it should clear up in a day or so.  SYMPTOMS TO REPORT IMMEDIATELY:   Following lower endoscopy (colonoscopy or flexible sigmoidoscopy):  Excessive amounts of blood in the stool  Significant tenderness or worsening of abdominal pains  Swelling of the abdomen that is new, acute  Fever of 100F or higher   For urgent or emergent issues, a gastroenterologist can be reached at any hour by calling [phone].   DIET:  We do recommend a small meal at first, but then you may proceed to your regular diet.  Drink plenty of fluids but you should avoid alcoholic beverages for 24 hours.  ACTIVITY:  You should plan to take it easy for the rest of today and you should NOT DRIVE or use heavy machinery until tomorrow (because of the sedation medicines used during the test).     FOLLOW UP: Our staff will call the number listed on your records the next business day following your procedure to check on you and address any questions or concerns that you may have regarding the information given to you following your procedure. If we do not reach you, we will leave a message.  However, if you are feeling well and you are not experiencing any problems, there is no need to return our call.  We will assume that you have returned to your regular daily activities without incident.  Polyp and hemorrhoid information given.   If any biopsies were taken you will be contacted by phone or by letter within the next 1-3 weeks.  Please call us at [phone] if you have not heard about the biopsies in 3 weeks.    SIGNATURES/CONFIDENTIALITY: You and/or your care partner have signed paperwork which will be entered into your electronic medical record.  These signatures attest to the fact that that the information above on your After Visit Summary has been reviewed and is understood.  Full responsibility of the confidentiality of this discharge information lies with you and/or your care-partner.

## 2017-10-21 NOTE — Op Note (Signed)
[facility] Patient Name: Susan Aguirre Procedure Date: 10/21/2017 1:38 PM MRN: [id number] Endoscopist: Docia Chuck. Susan Aguirre , MD Age: 57 Referring MD:  Date of Birth: Mar 01, 1960 Gender: Female Account #: [id number] Procedure:                Colonoscopy with cold snare polypectomy x 4 Indications:              High risk colon cancer surveillance: Personal                            history of non-advanced adenoma. Previous                            examinations 1999, 2009 (elsewhere) and 2013 Medicines:                Monitored Anesthesia Care Procedure:                Pre-Anesthesia Assessment:                           - Prior to the procedure, a History and Physical                            was performed, and patient medications and                            allergies were reviewed. The patient's tolerance of                            previous anesthesia was also reviewed. The risks                            and benefits of the procedure and the sedation                            options and risks were discussed with the patient.                            All questions were answered, and informed consent                            was obtained. Prior Anticoagulants: The patient has                            taken no previous anticoagulant or antiplatelet                            agents. ASA Grade Assessment: II - A patient with                            mild systemic disease. After reviewing the risks                            and benefits, the patient was deemed in  satisfactory condition to undergo the procedure.                           After obtaining informed consent, the colonoscope                            was passed under direct vision. Throughout the                            procedure, the patient's blood pressure, pulse, and                            oxygen saturations were monitored continuously. The        Model PCF-H190DL [id number]) scope was introduced                            through the anus and advanced to the the cecum,                            identified by appendiceal orifice and ileocecal                            valve. After obtaining informed consent, the                            colonoscope was passed under direct vision.                            Throughout the procedure, the patient's blood                            pressure, pulse, and oxygen saturations were                            monitored continuously.The ileocecal valve,                            appendiceal orifice, and rectum were photographed.                            The quality of the bowel preparation was excellent.                            The colonoscopy was performed without difficulty.                            The patient tolerated the procedure well. The bowel                            preparation used was SUPREP. Scope In: 1:41:18 PM Scope Out: 2:02:39 PM Scope Withdrawal Time: 0 hours 11 minutes 57 seconds  Total Procedure Duration: 0 hours 21 minutes 21 seconds  Findings:                 Four polyps were found  in the descending colon and                            transverse colon. The polyps were 3 to 5 mm in                            size. These polyps were removed with a cold snare.                            Resection and retrieval were complete.                           Internal hemorrhoids were found during retroflexion.                           There was marked fixed narrowing in the                            rectosigmoid region which presented considerable                            difficulty advancing the scope beyond. This was                            achieved with a combination of water insertion,                            patient position adjustment, and patience. The exam                            was otherwise without abnormality on direct and                             retroflexion views. Complications:            No immediate complications. Estimated blood loss:                            None. Estimated Blood Loss:     Estimated blood loss: none. Impression:               - Four 3 to 5 mm polyps in the descending colon and                            in the transverse colon, removed with a cold snare.                            Resected and retrieved.                           - Internal hemorrhoids.                           - Marked fixed rectosigmoid narrowing                           -  The examination was otherwise normal on direct                            and retroflexion views. Recommendation:           - Repeat colonoscopy in 3 or 5 years for                            surveillance (PEDIATRIC SCOPE).                           - Patient has a contact number available for                            emergencies. The signs and symptoms of potential                            delayed complications were discussed with the                            patient. Return to normal activities tomorrow.                            Written discharge instructions were provided to the                            patient.                           - Resume previous diet.                           - Continue present medications.                           - Await pathology results.                           - Stop smoking Susan Aguirre N. Susan Pastor, MD 10/21/2017 2:12:59 PM This report has been signed electronically.

## 2017-10-21 NOTE — Progress Notes (Signed)
A and O x3. Report to RN. Tolerated MAC anesthesia well.

## 2017-10-21 NOTE — Progress Notes (Signed)
Called to room to assist during endoscopic procedure.  Patient ID and intended procedure confirmed with present staff. Received instructions for my participation in the procedure from the performing physician.  

## 2017-10-22 ENCOUNTER — Telehealth: Payer: Self-pay

## 2017-10-22 NOTE — Telephone Encounter (Signed)
  Follow up Call-  Call back number 10/21/2017  Post procedure Call Back phone  # [phone]  Permission to leave phone message Yes  Some recent data might be hidden     Patient questions:  Do you have a fever, pain , or abdominal swelling? No. Pain Score  0 *  Have you tolerated food without any problems? Yes.    Have you been able to return to your normal activities? Yes.    Do you have any questions about your discharge instructions: Diet   No. Medications  No. Follow up visit  No.  Do you have questions or concerns about your Care? Patient had questions about her paperwork, specifically the findings about having a narrowing section of her colon in the rectosigmoid region. She mentioned having had previous surgery and had adhesions and scar tissue in her abdomen. I advised her that that could be the reason for the difficulty of reaching the cecum but the MD was able to get there. She understood after our conversation and had no further questions.   Actions: * If pain score is 4 or above: No action needed, pain <4.

## 2017-10-27 ENCOUNTER — Encounter: Payer: Self-pay | Admitting: Internal Medicine

## 2017-10-28 ENCOUNTER — Other Ambulatory Visit: Payer: Self-pay | Admitting: Internal Medicine

## 2017-11-02 ENCOUNTER — Other Ambulatory Visit: Payer: Self-pay | Admitting: Internal Medicine

## 2017-11-09 NOTE — Telephone Encounter (Signed)
Rx refilled.

## 2017-11-09 NOTE — Telephone Encounter (Signed)
Okay for refill? Please advise 

## 2017-11-12 ENCOUNTER — Encounter: Payer: Self-pay | Admitting: Adult Health

## 2017-11-12 ENCOUNTER — Ambulatory Visit (INDEPENDENT_AMBULATORY_CARE_PROVIDER_SITE_OTHER): Payer: 59 | Admitting: Adult Health

## 2017-11-12 VITALS — BP 126/84 | HR 79 | Temp 97.8°F | Resp 14 | Ht 66.0 in | Wt 98.0 lb

## 2017-11-12 DIAGNOSIS — Z7689 Persons encountering health services in other specified circumstances: Secondary | ICD-10-CM | POA: Diagnosis not present

## 2017-11-12 DIAGNOSIS — Z720 Tobacco use: Secondary | ICD-10-CM

## 2017-11-12 DIAGNOSIS — R69 Illness, unspecified: Secondary | ICD-10-CM | POA: Diagnosis not present

## 2017-11-12 DIAGNOSIS — F419 Anxiety disorder, unspecified: Secondary | ICD-10-CM | POA: Diagnosis not present

## 2017-11-12 DIAGNOSIS — I2510 Atherosclerotic heart disease of native coronary artery without angina pectoris: Secondary | ICD-10-CM | POA: Diagnosis not present

## 2017-11-12 DIAGNOSIS — Z23 Encounter for immunization: Secondary | ICD-10-CM | POA: Diagnosis not present

## 2017-11-12 DIAGNOSIS — I2584 Coronary atherosclerosis due to calcified coronary lesion: Secondary | ICD-10-CM | POA: Diagnosis not present

## 2017-11-12 MED ORDER — ALPRAZOLAM 0.5 MG PO TABS: 0.5000 mg | ORAL_TABLET | Freq: Three times a day (TID) | ORAL | 2 refills | Status: DC

## 2017-11-12 MED ORDER — ALBUTEROL SULFATE HFA 108 (90 BASE) MCG/ACT IN AERS: 3.0000 | INHALATION_SPRAY | RESPIRATORY_TRACT | 0 refills | Status: DC | PRN

## 2017-11-12 NOTE — Progress Notes (Addendum)
Patient presents to clinic today to establish care. She is a pleasant 57 year old female who  has a past medical history of Allergy, Anemia, Anxiety, Arthritis, Asthma, Calcification of aorta (HCC), CIN I (cervical intraepithelial neoplasia I), Depression, Diverticulosis, Dysplastic colon polyp, Endometriosis, Hemorrhoid, Nasal congestion (10/06/2017), Osteoporosis, Psoriasis, and Seasonal allergies.  She is a former patient of Dr. Raliegh Ip who I am seeing today to transfer care  Her last CPE was in April 2018   Acute Concerns: Establish Care   Chronic Issues: Anxiety and Depression - Takes Xanax 0.5 mg TID and Lexapro - She feels well controlled on this combination   Calcification of Aorta - takes lipitor and asa  Lab Results  Component Value Date   CHOL 170 04/21/2016   HDL 56.30 04/21/2016   LDLCALC 91 04/21/2016   TRIG 116.0 04/21/2016   CHOLHDL 3 04/21/2016   Asthma - Albuterol PRN   Tobacco Use - Has used Chantix in the past but had bad dreams. She would like to quit. She has tried patches in the past with good results. She smokes less than a pack a day.   Psoriasis - is followed by Dermatology.    Health Maintenance: Dental -- Routine Care  Vision -- Routine Care  Immunizations -- Due for flu shot  Colonoscopy -- Due in 2022 Mammogram -- Is due  PAP --  No longer needs  Bone Density --    Past Medical History:  Diagnosis Date  . Allergy   . Anemia    "borderline"  . Anxiety   . Arthritis   . Asthma    "bronchial asthmatic, flares up seasonally"  . Calcification of aorta (HCC)    Abdominal aorta  . CIN I (cervical intraepithelial neoplasia I)   . Depression   . Diverticulosis   . Dysplastic colon polyp   . Endometriosis   . Hemorrhoid   . Nasal congestion 10/06/2017  . Osteoporosis   . Psoriasis   . Seasonal allergies     Past Surgical History:  Procedure Laterality Date  . ABDOMINAL HYSTERECTOMY  2000   TAH,LSO (RSO IN 2002)  . APPENDECTOMY  1982    LYSIS OF BOWEL ADHESIONS  . COLON SURGERY     "small amount of colon taken at time of oophorectomy in 2002"  . COLONOSCOPY    . COLPOSCOPY    . GYNECOLOGIC CRYOSURGERY    . LIPOMA EXCISION     removed from back and right foot"done at different times"  . MASS EXCISION  12/24/2011   Procedure: EXCISION MASS;  Surgeon: Newt Minion, MD;  Location: [facility];  Service: Orthopedics;  Laterality: Right;  Right Foot Excision Soft Tissue Mass  . OOPHORECTOMY  2002   DX LAP W/RSO  . OOPHORECTOMY  2000   TAH W LSO  . PELVIC LAPAROSCOPY     DL  . POLYPECTOMY    . TONSILLECTOMY      Current Outpatient Medications on File Prior to Visit  Medication Sig Dispense Refill  . albuterol (PROVENTIL HFA;VENTOLIN HFA) 108 (90 Base) MCG/ACT inhaler Inhale 2 puffs into the lungs every 4 (four) hours as needed for wheezing or shortness of breath. 1 Inhaler 0  . ALPRAZolam (XANAX) 0.5 MG tablet TAKE 1 TABLET BY MOUTH THREE TIMES A DAY 15 tablet 0  . aspirin EC 81 MG tablet Take 81 mg by mouth. 1 every 3 -4 days    . atorvastatin (LIPITOR) 20 MG tablet Take 1 tablet (  20 mg total) by mouth daily. 90 tablet 2  . Calcium Carbonate-Vitamin D (CALCIUM + D PO) Take 1 tablet by mouth every other day.     . escitalopram (LEXAPRO) 10 MG tablet TAKE 1 TABLET BY MOUTH EVERYDAY AT BEDTIME (Patient taking differently: Take 5 mg by mouth daily. TAKE 1 TABLET BY MOUTH EVERYDAY AT BEDTIME) 90 tablet 4  . hydrocortisone 2.5 % cream Apply 1 application topically as needed.     No current facility-administered medications on file prior to visit.     Allergies  Allergen Reactions  . Codeine Nausea Only    Family History  Problem Relation Age of Onset  . Diabetes Mother   . Hypertension Mother   . Lymphoma Father   . Cancer Father   . Lymphoma Paternal Aunt   . Diabetes Maternal Grandmother   . Hypertension Maternal Grandmother   . Heart disease Maternal Grandmother   . Colon polyps Neg Hx   . Crohn's disease Neg  Hx   . Esophageal cancer Neg Hx   . Rectal cancer Neg Hx   . Stomach cancer Neg Hx     Social History   Socioeconomic History  . Marital status: Married    Spouse name: Not on file  . Number of children: Not on file  . Years of education: Not on file  . Highest education level: Not on file  Occupational History  . Not on file  Social Needs  . Financial resource strain: Not on file  . Food insecurity:    Worry: Not on file    Inability: Not on file  . Transportation needs:    Medical: Not on file    Non-medical: Not on file  Tobacco Use  . Smoking status: Current Every Day Smoker    Packs/day: 0.75    Types: Cigarettes  . Smokeless tobacco: Never Used  Substance and Sexual Activity  . Alcohol use: Yes    Alcohol/week: 5.0 standard drinks    Types: 5 Cans of beer per week    Comment: "social"  . Drug use: No  . Sexual activity: Yes    Birth control/protection: Surgical  Lifestyle  . Physical activity:    Days per week: Not on file    Minutes per session: Not on file  . Stress: Not on file  Relationships  . Social connections:    Talks on phone: Not on file    Gets together: Not on file    Attends religious service: Not on file    Active member of club or organization: Not on file    Attends meetings of clubs or organizations: Not on file    Relationship status: Not on file  . Intimate partner violence:    Fear of current or ex partner: Not on file    Emotionally abused: Not on file    Physically abused: Not on file    Forced sexual activity: Not on file  Other Topics Concern  . Not on file  Social History Narrative  . Not on file    Review of Systems  Constitutional: Negative.   Eyes: Negative.   Respiratory: Negative.   Cardiovascular: Negative.   Genitourinary: Negative.   Neurological: Negative.   Psychiatric/Behavioral: Negative.   All other systems reviewed and are negative.    BP 126/84   Pulse 79   Temp 97.8 F (36.6 C)   Resp 14   Ht  5\' 6"  (1.676 m)   Wt 98 lb (44.5  kg)   SpO2 98%   BMI 15.82 kg/m   Physical Exam  Constitutional: She is oriented to person, place, and time. She appears well-developed and well-nourished. No distress.  Cardiovascular: Normal rate, regular rhythm, normal heart sounds and intact distal pulses.  Pulmonary/Chest: Effort normal and breath sounds normal.  Neurological: She is alert and oriented to person, place, and time.  Skin: Skin is warm and dry. Rash noted. She is not diaphoretic.  Psoriasis rash on face, neck and lower extremities   Psychiatric: She has a normal mood and affect. Her behavior is normal. Judgment and thought content normal.  Nursing note and vitals reviewed.   Assessment/Plan: 1. Encounter to establish care - Follow up in April for CPE or sooner if needed  2. Tobacco use - Encouraged to quit. Try using the patches again  - albuterol (PROVENTIL HFA;VENTOLIN HFA) 108 (90 Base) MCG/ACT inhaler; Inhale 3 puffs into the lungs every 4 (four) hours as needed for wheezing or shortness of breath.  Dispense: 1 Inhaler; Refill: 0  3. Coronary artery calcification - Continue with statin   4. Anxiety - ALPRAZolam (XANAX) 0.5 MG tablet; Take 1 tablet (0.5 mg total) by mouth 3 (three) times daily.  Dispense: 90 tablet; Refill: 2   Dorothyann Peng, NP

## 2018-02-22 ENCOUNTER — Other Ambulatory Visit: Payer: Self-pay | Admitting: Adult Health

## 2018-02-22 DIAGNOSIS — Z720 Tobacco use: Secondary | ICD-10-CM

## 2018-03-18 DIAGNOSIS — M65331 Trigger finger, right middle finger: Secondary | ICD-10-CM | POA: Diagnosis not present

## 2018-03-18 DIAGNOSIS — M79641 Pain in right hand: Secondary | ICD-10-CM | POA: Diagnosis not present

## 2018-04-27 ENCOUNTER — Other Ambulatory Visit: Payer: Self-pay | Admitting: Adult Health

## 2018-06-29 ENCOUNTER — Other Ambulatory Visit: Payer: Self-pay | Admitting: *Deleted

## 2018-06-29 DIAGNOSIS — Z122 Encounter for screening for malignant neoplasm of respiratory organs: Secondary | ICD-10-CM

## 2018-06-29 DIAGNOSIS — F17210 Nicotine dependence, cigarettes, uncomplicated: Secondary | ICD-10-CM

## 2018-07-05 ENCOUNTER — Other Ambulatory Visit: Payer: Self-pay | Admitting: Adult Health

## 2018-08-02 ENCOUNTER — Inpatient Hospital Stay: Admission: RE | Admit: 2018-08-02 | Payer: 59 | Source: Ambulatory Visit

## 2018-08-03 ENCOUNTER — Ambulatory Visit (INDEPENDENT_AMBULATORY_CARE_PROVIDER_SITE_OTHER)
Admission: RE | Admit: 2018-08-03 | Discharge: 2018-08-03 | Disposition: A | Discharge status: Home / Self Care | Payer: 59 | Source: Ambulatory Visit | Attending: Acute Care | Admitting: Acute Care

## 2018-08-03 ENCOUNTER — Other Ambulatory Visit: Payer: Self-pay

## 2018-08-03 DIAGNOSIS — R69 Illness, unspecified: Secondary | ICD-10-CM | POA: Diagnosis not present

## 2018-08-03 DIAGNOSIS — F17210 Nicotine dependence, cigarettes, uncomplicated: Secondary | ICD-10-CM

## 2018-08-03 DIAGNOSIS — Z122 Encounter for screening for malignant neoplasm of respiratory organs: Secondary | ICD-10-CM

## 2018-08-13 ENCOUNTER — Other Ambulatory Visit: Payer: Self-pay | Admitting: *Deleted

## 2018-08-13 ENCOUNTER — Other Ambulatory Visit: Payer: Self-pay | Admitting: Family Medicine

## 2018-08-13 ENCOUNTER — Encounter: Payer: Self-pay | Admitting: Family Medicine

## 2018-08-13 DIAGNOSIS — Z122 Encounter for screening for malignant neoplasm of respiratory organs: Secondary | ICD-10-CM

## 2018-08-13 DIAGNOSIS — F17210 Nicotine dependence, cigarettes, uncomplicated: Secondary | ICD-10-CM

## 2018-08-13 MED ORDER — ATORVASTATIN CALCIUM 20 MG PO TABS: 20.0000 mg | ORAL_TABLET | Freq: Every day | ORAL | 0 refills | Status: DC

## 2018-08-13 NOTE — Telephone Encounter (Signed)
Filled for 30 days.  Pt is past due.  Letter mailed to the pt.

## 2018-08-30 DIAGNOSIS — L4050 Arthropathic psoriasis, unspecified: Secondary | ICD-10-CM | POA: Diagnosis not present

## 2018-09-08 ENCOUNTER — Other Ambulatory Visit: Payer: Self-pay | Admitting: Adult Health

## 2018-10-02 ENCOUNTER — Other Ambulatory Visit: Payer: Self-pay | Admitting: Adult Health

## 2018-10-05 ENCOUNTER — Ambulatory Visit (INDEPENDENT_AMBULATORY_CARE_PROVIDER_SITE_OTHER): Payer: 59 | Admitting: Adult Health

## 2018-10-05 ENCOUNTER — Encounter: Payer: Self-pay | Admitting: Adult Health

## 2018-10-05 ENCOUNTER — Telehealth: Payer: Self-pay | Admitting: Adult Health

## 2018-10-05 ENCOUNTER — Other Ambulatory Visit: Payer: Self-pay

## 2018-10-05 VITALS — BP 112/80 | Temp 97.3°F | Ht 66.0 in | Wt 92.0 lb

## 2018-10-05 DIAGNOSIS — I2584 Coronary atherosclerosis due to calcified coronary lesion: Secondary | ICD-10-CM | POA: Diagnosis not present

## 2018-10-05 DIAGNOSIS — E2839 Other primary ovarian failure: Secondary | ICD-10-CM | POA: Diagnosis not present

## 2018-10-05 DIAGNOSIS — R69 Illness, unspecified: Secondary | ICD-10-CM | POA: Diagnosis not present

## 2018-10-05 DIAGNOSIS — Z0000 Encounter for general adult medical examination without abnormal findings: Secondary | ICD-10-CM | POA: Diagnosis not present

## 2018-10-05 DIAGNOSIS — Z23 Encounter for immunization: Secondary | ICD-10-CM

## 2018-10-05 DIAGNOSIS — I2510 Atherosclerotic heart disease of native coronary artery without angina pectoris: Secondary | ICD-10-CM | POA: Diagnosis not present

## 2018-10-05 DIAGNOSIS — Z720 Tobacco use: Secondary | ICD-10-CM

## 2018-10-05 DIAGNOSIS — F419 Anxiety disorder, unspecified: Secondary | ICD-10-CM

## 2018-10-05 LAB — COMPREHENSIVE METABOLIC PANEL
ALT: 21 U/L (ref 0–35)
AST: 34 U/L (ref 0–37)
Albumin: 4.5 g/dL (ref 3.5–5.2)
Alkaline Phosphatase: 81 U/L (ref 39–117)
BUN: 15 mg/dL (ref 6–23)
CO2: 30 mEq/L (ref 19–32)
Calcium: 10.3 mg/dL (ref 8.4–10.5)
Chloride: 97 mEq/L (ref 96–112)
Creatinine, Ser: 0.62 mg/dL (ref 0.40–1.20)
GFR: 98.09 mL/min (ref >60.00)
Glucose, Bld: 84 mg/dL (ref 70–99)
Potassium: 4.7 mEq/L (ref 3.5–5.1)
Sodium: 136 mEq/L (ref 135–145)
Total Bilirubin: 1 mg/dL (ref 0.2–1.2)
Total Protein: 6.9 g/dL (ref 6.0–8.3)

## 2018-10-05 LAB — CBC WITH DIFFERENTIAL/PLATELET
Basophils Absolute: 0 10*3/uL (ref 0.0–0.1)
Basophils Relative: 0.3 % (ref 0.0–3.0)
Eosinophils Absolute: 0.1 10*3/uL (ref 0.0–0.7)
Eosinophils Relative: 0.9 % (ref 0.0–5.0)
HCT: 41.2 % (ref 36.0–46.0)
Hemoglobin: 14 g/dL (ref 12.0–15.0)
Lymphocytes Relative: 34.6 % (ref 12.0–46.0)
Lymphs Abs: 3.2 10*3/uL (ref 0.7–4.0)
MCHC: 34 g/dL (ref 30.0–36.0)
MCV: 93.4 fl (ref 78.0–100.0)
Monocytes Absolute: 0.7 10*3/uL (ref 0.1–1.0)
Monocytes Relative: 7.4 % (ref 3.0–12.0)
Neutro Abs: 5.3 10*3/uL (ref 1.4–7.7)
Neutrophils Relative %: 56.8 % (ref 43.0–77.0)
Platelets: 233 10*3/uL (ref 150.0–400.0)
RBC: 4.41 Mil/uL (ref 3.87–5.11)
RDW: 14 % (ref 11.5–15.5)
WBC: 9.3 10*3/uL (ref 4.0–10.5)

## 2018-10-05 LAB — LIPID PANEL
Cholesterol: 146 mg/dL (ref 0–200)
HDL: 91 mg/dL (ref >39.00)
LDL Cholesterol: 42 mg/dL (ref 0–99)
NonHDL: 55.32
Total CHOL/HDL Ratio: 2
Triglycerides: 69 mg/dL (ref 0.0–149.0)
VLDL: 13.8 mg/dL (ref 0.0–40.0)

## 2018-10-05 LAB — TSH: TSH: 1.42 u[IU]/mL (ref 0.35–4.50)

## 2018-10-05 NOTE — Telephone Encounter (Signed)
Sent to the pharmacy by e-scribe. 

## 2018-10-05 NOTE — Telephone Encounter (Signed)
Updated patient on labs  

## 2018-10-05 NOTE — Patient Instructions (Signed)
It was great seeing you today   We will follow up with you regarding your blood work   Please contact the breast center to schedule your mammogram   Address: [address], [place], [place] [address] Phone: [phone]

## 2018-10-05 NOTE — Addendum Note (Signed)
Addended by: Miles Costain T on: 10/05/2018 04:52 PM   Modules accepted: Orders

## 2018-10-05 NOTE — Progress Notes (Signed)
Subjective:    Patient ID: Susan Aguirre, female    DOB: March 27, 1960, 58 y.o.   MRN: [id number]  HPI Patient presents for yearly preventative medicine examination. She is a pleasant 58 year old female who  has a past medical history of Allergy, Anemia, Anxiety, Arthritis, Asthma, Calcification of aorta (HCC), CIN I (cervical intraepithelial neoplasia I), Depression, Diverticulosis, Dysplastic colon polyp, Endometriosis, Hemorrhoid, Nasal congestion (10/06/2017), Osteoporosis, Psoriasis, and Seasonal allergies.  Anxiety and Depression-She has weaned herself off Lexapro and is doing well but continues to take Xanax 0.5 mg TID.   Aortic Calcification - prescribed lipitor and ASA  Emphysema  - Albuterol PRN   Tobacco Use - She continues to smoke between 0.5 and 1 pack a day. She knows that she needs to quit. Has tried Chantix in the past but had bad dreams with this medication. She had her annual lung cancer screen done in July which showed   IMPRESSION: 1. Lung-RADS 2, benign appearance or behavior. Continue annual screening with low-dose chest CT without contrast in 12 months. 2. Aortic atherosclerosis (ICD10-170.0). Coronary artery calcification. 3.  Emphysema (ICD10-J43.9).  Psoriasis - is followed by Dermatology   All immunizations and health maintenance protocols were reviewed with the patient and needed orders were placed. She is due for influenza vaccination.   Appropriate screening laboratory values were ordered for the patient including screening of hyperlipidemia, renal function and hepatic function.  Medication reconciliation,  past medical history, social history, problem list and allergies were reviewed in detail with the patient  Goals were established with regard to weight loss, exercise, and  diet in compliance with medications  End of life planning was discussed.  She is up to date on routine dental and vision screens. She is due next year for screening  colonoscopy. She has had an abdominal hysterectomy and no longer needs a PAP. She is due for a mammogram   Review of Systems  Constitutional: Negative.   HENT: Negative.   Eyes: Negative.   Respiratory: Negative.   Cardiovascular: Negative.   Gastrointestinal: Negative.   Endocrine: Negative.   Genitourinary: Negative.   Musculoskeletal: Positive for arthralgias (bilateral knees).  Skin: Positive for rash.  Allergic/Immunologic: Negative.   Neurological: Negative.   Hematological: Negative.   Psychiatric/Behavioral: Negative.   All other systems reviewed and are negative.  Past Medical History:  Diagnosis Date  . Allergy   . Anemia    "borderline"  . Anxiety   . Arthritis   . Asthma    "bronchial asthmatic, flares up seasonally"  . Calcification of aorta (HCC)    Abdominal aorta  . CIN I (cervical intraepithelial neoplasia I)   . Depression   . Diverticulosis   . Dysplastic colon polyp   . Endometriosis   . Hemorrhoid   . Nasal congestion 10/06/2017  . Osteoporosis   . Psoriasis   . Seasonal allergies     Social History   Socioeconomic History  . Marital status: Married    Spouse name: Not on file  . Number of children: Not on file  . Years of education: Not on file  . Highest education level: Not on file  Occupational History  . Not on file  Social Needs  . Financial resource strain: Not on file  . Food insecurity    Worry: Not on file    Inability: Not on file  . Transportation needs    Medical: Not on file    Non-medical: Not on file  Tobacco Use  . Smoking status: Current Every Day Smoker    Packs/day: 0.75    Types: Cigarettes  . Smokeless tobacco: Never Used  Substance and Sexual Activity  . Alcohol use: Yes    Alcohol/week: 5.0 standard drinks    Types: 5 Cans of beer per week    Comment: "social"  . Drug use: No  . Sexual activity: Yes    Birth control/protection: Surgical  Lifestyle  . Physical activity    Days per week: Not on file     Minutes per session: Not on file  . Stress: Not on file  Relationships  . Social Herbalist on phone: Not on file    Gets together: Not on file    Attends religious service: Not on file    Active member of club or organization: Not on file    Attends meetings of clubs or organizations: Not on file    Relationship status: Not on file  . Intimate partner violence    Fear of current or ex partner: Not on file    Emotionally abused: Not on file    Physically abused: Not on file    Forced sexual activity: Not on file  Other Topics Concern  . Not on file  Social History Narrative  . Not on file    Past Surgical History:  Procedure Laterality Date  . ABDOMINAL HYSTERECTOMY  2000   TAH,LSO (RSO IN 2002)  . APPENDECTOMY  1982   LYSIS OF BOWEL ADHESIONS  . COLON SURGERY     "small amount of colon taken at time of oophorectomy in 2002"  . COLONOSCOPY    . COLPOSCOPY    . GYNECOLOGIC CRYOSURGERY    . LIPOMA EXCISION     removed from back and right foot"done at different times"  . MASS EXCISION  12/24/2011   Procedure: EXCISION MASS;  Surgeon: Newt Minion, MD;  Location: [facility];  Service: Orthopedics;  Laterality: Right;  Right Foot Excision Soft Tissue Mass  . OOPHORECTOMY  2002   DX LAP W/RSO  . OOPHORECTOMY  2000   TAH W LSO  . PELVIC LAPAROSCOPY     DL  . POLYPECTOMY    . TONSILLECTOMY      Family History  Problem Relation Age of Onset  . Diabetes Mother   . Hypertension Mother   . Lymphoma Father   . Cancer Father   . Lymphoma Paternal Aunt   . Diabetes Maternal Grandmother   . Hypertension Maternal Grandmother   . Heart disease Maternal Grandmother   . Colon polyps Neg Hx   . Crohn's disease Neg Hx   . Esophageal cancer Neg Hx   . Rectal cancer Neg Hx   . Stomach cancer Neg Hx     Allergies  Allergen Reactions  . Codeine Nausea Only    Current Outpatient Medications on File Prior to Visit  Medication Sig Dispense Refill  . albuterol  (PROVENTIL HFA;VENTOLIN HFA) 108 (90 Base) MCG/ACT inhaler INHALE 3 PUFFS INTO THE LUNGS EVERY 4 (FOUR) HOURS AS NEEDED FOR WHEEZING OR SHORTNESS OF BREATH. 6.7 Inhaler 0  . aspirin EC 81 MG tablet Take 81 mg by mouth. 1 every 3 -4 days    . Calcium Carbonate-Vitamin D (CALCIUM + D PO) Take 1 tablet by mouth every other day.     . hydrocortisone 2.5 % cream Apply 1 application topically as needed.    . ALPRAZolam (XANAX) 0.5 MG tablet Take  1 tablet (0.5 mg total) by mouth 3 (three) times daily. 90 tablet 2  . atorvastatin (LIPITOR) 20 MG tablet Take 1 tablet (20 mg total) by mouth at bedtime. 90 tablet 3   No current facility-administered medications on file prior to visit.     BP 112/80   Temp (!) 97.3 F (36.3 C) (Temporal)   Ht 5\' 6"  (1.676 m)   Wt 92 lb (41.7 kg)   BMI 14.85 kg/m       Objective:   Physical Exam Vitals signs and nursing note reviewed.  Constitutional:      General: She is not in acute distress.    Appearance: Normal appearance. She is normal weight. She is not diaphoretic.  HENT:     Head: Normocephalic and atraumatic.     Right Ear: Tympanic membrane, ear canal and external ear normal. There is no impacted cerumen.     Left Ear: Tympanic membrane, ear canal and external ear normal. There is no impacted cerumen.     Nose: Nose normal. No congestion or rhinorrhea.     Mouth/Throat:     Mouth: Mucous membranes are moist.     Pharynx: Oropharynx is clear. No oropharyngeal exudate or posterior oropharyngeal erythema.  Eyes:     General: No scleral icterus.       Right eye: No discharge.        Left eye: No discharge.     Conjunctiva/sclera: Conjunctivae normal.     Pupils: Pupils are equal, round, and reactive to light.  Neck:     Musculoskeletal: Normal range of motion and neck supple.     Thyroid: No thyromegaly.     Vascular: No JVD.     Trachea: No tracheal deviation.  Cardiovascular:     Rate and Rhythm: Normal rate and regular rhythm.     Pulses:  Normal pulses.     Heart sounds: Normal heart sounds. No murmur. No friction rub. No gallop.   Pulmonary:     Effort: Pulmonary effort is normal. No respiratory distress.     Breath sounds: Normal breath sounds. No stridor. No wheezing, rhonchi or rales.  Chest:     Chest wall: No tenderness.  Abdominal:     General: Bowel sounds are normal. There is no distension.     Palpations: Abdomen is soft. There is no mass.     Tenderness: There is no abdominal tenderness. There is no right CVA tenderness, left CVA tenderness, guarding or rebound.     Hernia: No hernia is present.  Musculoskeletal: Normal range of motion.        General: No swelling, tenderness, deformity or signs of injury.     Right lower leg: No edema.     Left lower leg: No edema.  Lymphadenopathy:     Cervical: No cervical adenopathy.  Skin:    General: Skin is warm and dry.     Capillary Refill: Capillary refill takes less than 2 seconds.     Coloration: Skin is not jaundiced or pale.     Findings: No bruising, erythema, lesion or rash.  Neurological:     General: No focal deficit present.     Mental Status: She is alert and oriented to person, place, and time. Mental status is at baseline.     Cranial Nerves: No cranial nerve deficit.     Sensory: No sensory deficit.     Motor: No weakness or abnormal muscle tone.     Coordination: Coordination normal.  Gait: Gait normal.     Deep Tendon Reflexes: Reflexes normal.  Psychiatric:        Mood and Affect: Mood normal.        Behavior: Behavior normal.        Thought Content: Thought content normal.        Judgment: Judgment normal.       Assessment & Plan:  1. Routine general medical examination at a health care facility  - CBC with Differential/Platelet - Comprehensive metabolic panel - Lipid panel - TSH  2. Estrogen deficiency  - MM DIGITAL SCREENING BILATERAL; Future  3. Tobacco use - Encouraged to quit.  - Try using patch or gum   4. Coronary  artery calcification - Continue with lipitor and ASA - CBC with Differential/Platelet - Comprehensive metabolic panel - Lipid panel - TSH  5. Anxiety - Continue with Xanax 0.5 mg TID - encouraged to cut back on use    Dorothyann Peng, NP

## 2018-10-07 DIAGNOSIS — M25561 Pain in right knee: Secondary | ICD-10-CM | POA: Diagnosis not present

## 2018-10-11 ENCOUNTER — Other Ambulatory Visit: Payer: Self-pay | Admitting: Adult Health

## 2018-10-21 DIAGNOSIS — R5382 Chronic fatigue, unspecified: Secondary | ICD-10-CM | POA: Diagnosis not present

## 2018-10-21 DIAGNOSIS — Z681 Body mass index (BMI) 19 or less, adult: Secondary | ICD-10-CM | POA: Diagnosis not present

## 2018-10-21 DIAGNOSIS — M549 Dorsalgia, unspecified: Secondary | ICD-10-CM | POA: Diagnosis not present

## 2018-10-21 DIAGNOSIS — M2550 Pain in unspecified joint: Secondary | ICD-10-CM | POA: Diagnosis not present

## 2018-10-21 DIAGNOSIS — L401 Generalized pustular psoriasis: Secondary | ICD-10-CM | POA: Diagnosis not present

## 2018-12-21 DIAGNOSIS — M549 Dorsalgia, unspecified: Secondary | ICD-10-CM | POA: Diagnosis not present

## 2018-12-21 DIAGNOSIS — M2550 Pain in unspecified joint: Secondary | ICD-10-CM | POA: Diagnosis not present

## 2018-12-21 DIAGNOSIS — R5382 Chronic fatigue, unspecified: Secondary | ICD-10-CM | POA: Diagnosis not present

## 2018-12-21 DIAGNOSIS — L401 Generalized pustular psoriasis: Secondary | ICD-10-CM | POA: Diagnosis not present

## 2018-12-21 DIAGNOSIS — Z681 Body mass index (BMI) 19 or less, adult: Secondary | ICD-10-CM | POA: Diagnosis not present

## 2019-01-03 DIAGNOSIS — M2550 Pain in unspecified joint: Secondary | ICD-10-CM | POA: Diagnosis not present

## 2019-01-16 ENCOUNTER — Other Ambulatory Visit: Payer: Self-pay | Admitting: Adult Health

## 2019-01-18 NOTE — Telephone Encounter (Signed)
Okay for refill?   Last ov 10/05/2018 for general exam

## 2019-02-07 DIAGNOSIS — R5382 Chronic fatigue, unspecified: Secondary | ICD-10-CM | POA: Diagnosis not present

## 2019-02-07 DIAGNOSIS — Z681 Body mass index (BMI) 19 or less, adult: Secondary | ICD-10-CM | POA: Diagnosis not present

## 2019-02-07 DIAGNOSIS — M2550 Pain in unspecified joint: Secondary | ICD-10-CM | POA: Diagnosis not present

## 2019-02-07 DIAGNOSIS — L401 Generalized pustular psoriasis: Secondary | ICD-10-CM | POA: Diagnosis not present

## 2019-02-07 DIAGNOSIS — M549 Dorsalgia, unspecified: Secondary | ICD-10-CM | POA: Diagnosis not present

## 2019-02-16 ENCOUNTER — Encounter: Payer: Self-pay | Admitting: *Deleted

## 2019-03-22 ENCOUNTER — Ambulatory Visit
Admission: RE | Admit: 2019-03-22 | Discharge: 2019-03-22 | Disposition: A | Discharge status: Home / Self Care | Payer: 59 | Source: Ambulatory Visit | Attending: Adult Health | Admitting: Adult Health

## 2019-03-22 ENCOUNTER — Other Ambulatory Visit: Payer: Self-pay

## 2019-03-22 DIAGNOSIS — Z1231 Encounter for screening mammogram for malignant neoplasm of breast: Secondary | ICD-10-CM | POA: Diagnosis not present

## 2019-03-22 DIAGNOSIS — E2839 Other primary ovarian failure: Secondary | ICD-10-CM

## 2019-04-22 ENCOUNTER — Other Ambulatory Visit: Payer: Self-pay | Admitting: Adult Health

## 2019-04-22 ENCOUNTER — Ambulatory Visit: Payer: 59 | Attending: Internal Medicine

## 2019-04-22 DIAGNOSIS — Z23 Encounter for immunization: Secondary | ICD-10-CM

## 2019-04-22 NOTE — Progress Notes (Signed)
   Covid-19 Vaccination Clinic  Name:  Susan Aguirre    MRN: [id number] DOB: 02/19/1960  04/22/2019  [name] was observed post Covid-19 immunization for 15 minutes without incident. She was provided with Vaccine Information Sheet and instruction to access the V-Safe system.   [name] was instructed to call 911 with any severe reactions post vaccine: Marland Kitchen Difficulty breathing  . Swelling of face and throat  . A fast heartbeat  . A bad rash all over body  . Dizziness and weakness   Immunizations Administered    Name Date Dose VIS Date Route   Pfizer COVID-19 Vaccine 04/22/2019  9:00 AM 0.3 mL 12/17/2018 Intramuscular   Manufacturer: [employer]   Lot: [id number]   [place]: [id number]

## 2019-05-11 DIAGNOSIS — M545 Low back pain: Secondary | ICD-10-CM | POA: Diagnosis not present

## 2019-05-16 ENCOUNTER — Ambulatory Visit: Payer: 59 | Attending: Internal Medicine

## 2019-05-16 DIAGNOSIS — Z23 Encounter for immunization: Secondary | ICD-10-CM

## 2019-05-16 NOTE — Progress Notes (Signed)
   Covid-19 Vaccination Clinic  Name:  Susan Aguirre    MRN: [id number] DOB: December 25, 1960  05/16/2019  Susan Aguirre was observed post Covid-19 immunization for 15 minutes without incident. She was provided with Vaccine Information Sheet and instruction to access the V-Safe system.   Susan Aguirre was instructed to call 911 with any severe reactions post vaccine: Marland Kitchen Difficulty breathing  . Swelling of face and throat  . A fast heartbeat  . A bad rash all over body  . Dizziness and weakness   Immunizations Administered    Name Date Dose VIS Date Route   Pfizer COVID-19 Vaccine 05/16/2019  8:44 AM 0.3 mL 03/02/2018 Intramuscular   Manufacturer: [place]   Lot: [id number]   [place]: [id number]

## 2019-05-17 DIAGNOSIS — M5106 Intervertebral disc disorders with myelopathy, lumbar region: Secondary | ICD-10-CM | POA: Diagnosis not present

## 2019-05-24 DIAGNOSIS — M5417 Radiculopathy, lumbosacral region: Secondary | ICD-10-CM | POA: Diagnosis not present

## 2019-05-24 DIAGNOSIS — M5127 Other intervertebral disc displacement, lumbosacral region: Secondary | ICD-10-CM | POA: Diagnosis not present

## 2019-06-09 DIAGNOSIS — M5127 Other intervertebral disc displacement, lumbosacral region: Secondary | ICD-10-CM | POA: Diagnosis not present

## 2019-06-09 DIAGNOSIS — M5117 Intervertebral disc disorders with radiculopathy, lumbosacral region: Secondary | ICD-10-CM | POA: Diagnosis not present

## 2019-06-09 HISTORY — PX: LUMBAR DISC SURGERY: SHX700

## 2019-06-20 DIAGNOSIS — H25013 Cortical age-related cataract, bilateral: Secondary | ICD-10-CM | POA: Diagnosis not present

## 2019-06-20 DIAGNOSIS — H5203 Hypermetropia, bilateral: Secondary | ICD-10-CM | POA: Diagnosis not present

## 2019-06-20 DIAGNOSIS — Z0100 Encounter for examination of eyes and vision without abnormal findings: Secondary | ICD-10-CM | POA: Diagnosis not present

## 2019-06-29 ENCOUNTER — Encounter: Payer: Self-pay | Admitting: Family Medicine

## 2019-07-16 DIAGNOSIS — M542 Cervicalgia: Secondary | ICD-10-CM | POA: Diagnosis not present

## 2019-07-16 DIAGNOSIS — M25512 Pain in left shoulder: Secondary | ICD-10-CM | POA: Diagnosis not present

## 2019-07-19 ENCOUNTER — Telehealth (INDEPENDENT_AMBULATORY_CARE_PROVIDER_SITE_OTHER): Payer: No Typology Code available for payment source | Admitting: Adult Health

## 2019-07-19 ENCOUNTER — Telehealth: Payer: No Typology Code available for payment source | Admitting: Adult Health

## 2019-07-19 ENCOUNTER — Other Ambulatory Visit: Payer: Self-pay

## 2019-07-19 ENCOUNTER — Encounter: Payer: Self-pay | Admitting: Adult Health

## 2019-07-19 VITALS — Wt 105.0 lb

## 2019-07-19 DIAGNOSIS — I6529 Occlusion and stenosis of unspecified carotid artery: Secondary | ICD-10-CM | POA: Diagnosis not present

## 2019-07-19 NOTE — Progress Notes (Signed)
Virtual Visit via Telephone Note  I connected with Susan Aguirre on 07/19/19 at  7:30 AM EDT by telephone and verified that I am speaking with the correct person using two identifiers.   I discussed the limitations, risks, security and privacy concerns of performing an evaluation and management service by telephone and the availability of in person appointments. I also discussed with the patient that there may be a patient responsible charge related to this service. The patient expressed understanding and agreed to proceed.  Location patient: home Location provider: work or home office Participants present for the call: patient, provider Patient did not have a visit in the prior 7 days to address this/these issue(s).   History of Present Illness: He is being evaluated today for concern of carotid artery atherosclerosis.  She reports that she was seen at urgent care at [facility] over the weekend and had a x-ray of her neck done which "showed plaque buildup in her carotid arteries".  She was advised to follow-up with her PCP for further evaluation.  She is asymptomatic   Observations/Objective: Patient sounds cheerful and well on the phone. I do not appreciate any SOB. Speech and thought processing are grossly intact. Patient reported vitals:  Assessment and Plan: 1. Carotid atherosclerosis, unspecified laterality  - US Carotid Duplex Bilateral; Future - Continue stain and ASA  Follow Up Instructions:  I did not refer this patient for an OV in the next 24 hours for this/these issue(s).  I discussed the assessment and treatment plan with the patient. The patient was provided an opportunity to ask questions and all were answered. The patient agreed with the plan and demonstrated an understanding of the instructions.   The patient was advised to call back or seek an in-person evaluation if the symptoms worsen or if the condition fails to improve as anticipated.  I provided  15 minutes of non-face-to-face time during this encounter.   Susan Peng, NP

## 2019-07-20 DIAGNOSIS — M2550 Pain in unspecified joint: Secondary | ICD-10-CM | POA: Diagnosis not present

## 2019-07-20 DIAGNOSIS — R5382 Chronic fatigue, unspecified: Secondary | ICD-10-CM | POA: Diagnosis not present

## 2019-07-20 DIAGNOSIS — Z681 Body mass index (BMI) 19 or less, adult: Secondary | ICD-10-CM | POA: Diagnosis not present

## 2019-07-20 DIAGNOSIS — M549 Dorsalgia, unspecified: Secondary | ICD-10-CM | POA: Diagnosis not present

## 2019-07-20 DIAGNOSIS — L401 Generalized pustular psoriasis: Secondary | ICD-10-CM | POA: Diagnosis not present

## 2019-07-22 ENCOUNTER — Other Ambulatory Visit: Payer: Self-pay | Admitting: Adult Health

## 2019-07-28 ENCOUNTER — Ambulatory Visit
Admission: RE | Admit: 2019-07-28 | Discharge: 2019-07-28 | Disposition: A | Discharge status: Home / Self Care | Payer: No Typology Code available for payment source | Source: Ambulatory Visit | Attending: Adult Health | Admitting: Adult Health

## 2019-07-28 DIAGNOSIS — I6529 Occlusion and stenosis of unspecified carotid artery: Secondary | ICD-10-CM

## 2019-07-28 DIAGNOSIS — I63233 Cerebral infarction due to unspecified occlusion or stenosis of bilateral carotid arteries: Secondary | ICD-10-CM | POA: Diagnosis not present

## 2019-07-29 ENCOUNTER — Telehealth: Payer: Self-pay | Admitting: Adult Health

## 2019-07-29 NOTE — Telephone Encounter (Signed)
Spoke to patient and informed her of her ultrasound results which showed  IMPRESSION: 1. Calcified plaque bilaterally resulting in less than 50% stenosis by peak systolic velocity measurements of the bilateral ICAs. However, by grayscale imaging, the degree of stenosis is estimated to be around 50% bilaterally. 2. Antegrade flow is noted within both vertebral arteries.  She is taking a daily baby aspirin as well as on Lipitor 20 mg.  We will increase her Lipitor to 40 mg and repeat ultrasound and 1 to 2 years

## 2019-08-04 ENCOUNTER — Encounter: Payer: Self-pay | Admitting: Acute Care

## 2019-08-25 ENCOUNTER — Other Ambulatory Visit: Payer: Self-pay | Admitting: Adult Health

## 2019-08-30 ENCOUNTER — Other Ambulatory Visit: Payer: Self-pay

## 2019-08-30 ENCOUNTER — Ambulatory Visit (INDEPENDENT_AMBULATORY_CARE_PROVIDER_SITE_OTHER)
Admission: RE | Admit: 2019-08-30 | Discharge: 2019-08-30 | Disposition: A | Discharge status: Home / Self Care | Payer: No Typology Code available for payment source | Source: Ambulatory Visit | Attending: Acute Care | Admitting: Acute Care

## 2019-08-30 DIAGNOSIS — Z87891 Personal history of nicotine dependence: Secondary | ICD-10-CM | POA: Diagnosis not present

## 2019-08-30 DIAGNOSIS — F17210 Nicotine dependence, cigarettes, uncomplicated: Secondary | ICD-10-CM

## 2019-08-30 DIAGNOSIS — Z122 Encounter for screening for malignant neoplasm of respiratory organs: Secondary | ICD-10-CM

## 2019-09-01 NOTE — Progress Notes (Signed)
Please call patient and let them  know their  low dose Ct was read as a Lung RADS 2: nodules that are benign in appearance and behavior with a very low likelihood of becoming a clinically active cancer due to size or lack of growth. Recommendation per radiology is for a repeat LDCT in 12 months. .Please let them  know we will order and schedule their  annual screening scan for 08/2020. Please let them  know there was notation of CAD on their  scan.  Please remind the patient  that this is a non-gated exam therefore degree or severity of disease  cannot be determined. Please have them  follow up with their PCP regarding potential risk factor modification, dietary therapy or pharmacologic therapy if clinically indicated. Pt.  is  currently on statin therapy. Please place order for annual  screening scan for  08/2020 and fax results to PCP. Thanks so much. 

## 2019-09-07 ENCOUNTER — Other Ambulatory Visit: Payer: Self-pay | Admitting: *Deleted

## 2019-09-07 DIAGNOSIS — F17210 Nicotine dependence, cigarettes, uncomplicated: Secondary | ICD-10-CM

## 2019-09-15 ENCOUNTER — Other Ambulatory Visit: Payer: Self-pay | Admitting: Adult Health

## 2019-09-20 ENCOUNTER — Other Ambulatory Visit: Payer: Self-pay | Admitting: Adult Health

## 2019-09-20 ENCOUNTER — Telehealth: Payer: Self-pay | Admitting: Adult Health

## 2019-09-20 MED ORDER — ATORVASTATIN CALCIUM 40 MG PO TABS: 40.0000 mg | ORAL_TABLET | Freq: Every day | ORAL | 3 refills | Status: DC

## 2019-09-20 NOTE — Telephone Encounter (Signed)
Pt is calling to have her medication   atorvastatin (LIPITOR) 20 MG tablet   Refilled. She was told by Tommi Rumps to take 2 a day which is 40 MG and she has ran out. The pharmacy said the insurance will not refill it until the 20 th or 21 st  of September. She has been out for about 5 days.  She wants to know if a prescription can be sent in for a refill  [id number] - [place], [place] - [address]  [address] [place], [place] [address]  Phone:  [phone] Fax:  [phone]

## 2019-09-21 ENCOUNTER — Other Ambulatory Visit: Payer: Self-pay | Admitting: Adult Health

## 2019-11-01 DIAGNOSIS — M5127 Other intervertebral disc displacement, lumbosacral region: Secondary | ICD-10-CM | POA: Diagnosis not present

## 2019-11-23 ENCOUNTER — Telehealth: Payer: Self-pay | Admitting: Adult Health

## 2019-11-23 DIAGNOSIS — L570 Actinic keratosis: Secondary | ICD-10-CM | POA: Diagnosis not present

## 2019-11-23 DIAGNOSIS — L814 Other melanin hyperpigmentation: Secondary | ICD-10-CM | POA: Diagnosis not present

## 2019-11-23 DIAGNOSIS — D229 Melanocytic nevi, unspecified: Secondary | ICD-10-CM | POA: Diagnosis not present

## 2019-11-23 DIAGNOSIS — D1801 Hemangioma of skin and subcutaneous tissue: Secondary | ICD-10-CM | POA: Diagnosis not present

## 2019-11-23 DIAGNOSIS — L821 Other seborrheic keratosis: Secondary | ICD-10-CM | POA: Diagnosis not present

## 2019-11-23 DIAGNOSIS — L400 Psoriasis vulgaris: Secondary | ICD-10-CM | POA: Diagnosis not present

## 2019-11-23 NOTE — Telephone Encounter (Signed)
error 

## 2019-11-24 ENCOUNTER — Other Ambulatory Visit: Payer: Self-pay | Admitting: Adult Health

## 2019-11-24 DIAGNOSIS — Z79899 Other long term (current) drug therapy: Secondary | ICD-10-CM | POA: Diagnosis not present

## 2019-11-24 DIAGNOSIS — L400 Psoriasis vulgaris: Secondary | ICD-10-CM | POA: Diagnosis not present

## 2020-01-10 ENCOUNTER — Other Ambulatory Visit: Payer: Self-pay

## 2020-01-10 ENCOUNTER — Encounter: Payer: Self-pay | Admitting: Adult Health

## 2020-01-10 ENCOUNTER — Ambulatory Visit (INDEPENDENT_AMBULATORY_CARE_PROVIDER_SITE_OTHER): Payer: No Typology Code available for payment source | Admitting: Adult Health

## 2020-01-10 VITALS — BP 128/72 | Temp 98.0°F | Ht 66.0 in | Wt 106.0 lb

## 2020-01-10 DIAGNOSIS — F419 Anxiety disorder, unspecified: Secondary | ICD-10-CM

## 2020-01-10 DIAGNOSIS — I6529 Occlusion and stenosis of unspecified carotid artery: Secondary | ICD-10-CM

## 2020-01-10 DIAGNOSIS — Z0000 Encounter for general adult medical examination without abnormal findings: Secondary | ICD-10-CM | POA: Diagnosis not present

## 2020-01-10 DIAGNOSIS — Z720 Tobacco use: Secondary | ICD-10-CM

## 2020-01-10 DIAGNOSIS — R69 Illness, unspecified: Secondary | ICD-10-CM | POA: Diagnosis not present

## 2020-01-10 DIAGNOSIS — J439 Emphysema, unspecified: Secondary | ICD-10-CM

## 2020-01-10 LAB — LIPID PANEL
Cholesterol: 128 mg/dL (ref 0–200)
HDL: 42.6 mg/dL (ref >39.00)
LDL Cholesterol: 65 mg/dL (ref 0–99)
NonHDL: 85.73
Total CHOL/HDL Ratio: 3
Triglycerides: 103 mg/dL (ref 0.0–149.0)
VLDL: 20.6 mg/dL (ref 0.0–40.0)

## 2020-01-10 LAB — CBC WITH DIFFERENTIAL/PLATELET
Basophils Absolute: 0 10*3/uL (ref 0.0–0.1)
Basophils Relative: 0.4 % (ref 0.0–3.0)
Eosinophils Absolute: 0.2 10*3/uL (ref 0.0–0.7)
Eosinophils Relative: 2.6 % (ref 0.0–5.0)
HCT: 39.7 % (ref 36.0–46.0)
Hemoglobin: 13.3 g/dL (ref 12.0–15.0)
Lymphocytes Relative: 44.8 % (ref 12.0–46.0)
Lymphs Abs: 3.3 10*3/uL (ref 0.7–4.0)
MCHC: 33.4 g/dL (ref 30.0–36.0)
MCV: 87.9 fl (ref 78.0–100.0)
Monocytes Absolute: 0.7 10*3/uL (ref 0.1–1.0)
Monocytes Relative: 10.1 % (ref 3.0–12.0)
Neutro Abs: 3.1 10*3/uL (ref 1.4–7.7)
Neutrophils Relative %: 42.1 % — ABNORMAL LOW (ref 43.0–77.0)
Platelets: 259 10*3/uL (ref 150.0–400.0)
RBC: 4.51 Mil/uL (ref 3.87–5.11)
RDW: 14.4 % (ref 11.5–15.5)
WBC: 7.3 10*3/uL (ref 4.0–10.5)

## 2020-01-10 LAB — COMPREHENSIVE METABOLIC PANEL
ALT: 7 U/L (ref 0–35)
AST: 15 U/L (ref 0–37)
Albumin: 4.3 g/dL (ref 3.5–5.2)
Alkaline Phosphatase: 79 U/L (ref 39–117)
BUN: 11 mg/dL (ref 6–23)
CO2: 33 mEq/L — ABNORMAL HIGH (ref 19–32)
Calcium: 9.3 mg/dL (ref 8.4–10.5)
Chloride: 99 mEq/L (ref 96–112)
Creatinine, Ser: 0.68 mg/dL (ref 0.40–1.20)
GFR: 93.91 mL/min (ref >60.00)
Glucose, Bld: 73 mg/dL (ref 70–99)
Potassium: 4.7 mEq/L (ref 3.5–5.1)
Sodium: 136 mEq/L (ref 135–145)
Total Bilirubin: 0.4 mg/dL (ref 0.2–1.2)
Total Protein: 6.6 g/dL (ref 6.0–8.3)

## 2020-01-10 LAB — TSH: TSH: 2.86 u[IU]/mL (ref 0.35–4.50)

## 2020-01-10 MED ORDER — ALBUTEROL SULFATE HFA 108 (90 BASE) MCG/ACT IN AERS: 3.0000 | INHALATION_SPRAY | RESPIRATORY_TRACT | 0 refills | Status: DC | PRN

## 2020-01-10 NOTE — Progress Notes (Signed)
Subjective:    Patient ID: Susan Aguirre, female    DOB: June 08, 1960, 60 y.o.   MRN: [id number]  HPI Patient presents for yearly preventative medicine examination. She is a pleasant 60 year old female who  has a past medical history of Allergy, Anemia, Anxiety, Arthritis, Asthma, Calcification of aorta (HCC), CIN I (cervical intraepithelial neoplasia I), Depression, Diverticulosis, Dysplastic colon polyp, Endometriosis, Hemorrhoid, Nasal congestion (10/06/2017), Osteoporosis, Psoriasis, and Seasonal allergies.  Anxiety -Xanax 0.5 mg 3 times daily. She feels well controlled on this medication  Tobacco Use -continues to smoke between a half a pack and 1 pack a day. She uses albuterol inhaler as needed for emphysema. At her yearly low-dose CT scan lung cancer screening done in August 2021 which showed lung-RADS two, benign behavior appearance. Continue with annual low-dose chest CT in 12 months. Also showed aortic atherosclerosis and emphysema. She knows she needs to quit.  Aortic atherosclerosis-prescribe Lipitor 40 mg daily Lab Results  Component Value Date   CHOL 146 10/05/2018   HDL 91.00 10/05/2018   LDLCALC 42 10/05/2018   TRIG 69.0 10/05/2018   CHOLHDL 2 10/05/2018   Psoriasis - was switched from [place] to [employer]. Her first shot was last week   All immunizations and health maintenance protocols were reviewed with the patient and needed orders were placed.  Appropriate screening laboratory values were ordered for the patient including screening of hyperlipidemia, renal function and hepatic function.  Medication reconciliation,  past medical history, social history, problem list and allergies were reviewed in detail with the patient  Goals were established with regard to weight loss, exercise, and  diet in compliance with medications  She is due this upcoming October for repeat colonoscopy, she is on 3-year plan.  She denies any acute complaints.    Review of Systems   Constitutional: Negative.   HENT: Negative.   Eyes: Negative.   Respiratory: Negative.   Cardiovascular: Negative.   Gastrointestinal: Negative.   Endocrine: Negative.   Genitourinary: Negative.   Musculoskeletal: Positive for back pain (chronic ).  Skin: Negative.   Allergic/Immunologic: Negative.   Neurological: Negative.   Hematological: Negative.   Psychiatric/Behavioral: Negative.    Past Medical History:  Diagnosis Date  . Allergy   . Anemia    "borderline"  . Anxiety   . Arthritis   . Asthma    "bronchial asthmatic, flares up seasonally"  . Calcification of aorta (HCC)    Abdominal aorta  . CIN I (cervical intraepithelial neoplasia I)   . Depression   . Diverticulosis   . Dysplastic colon polyp   . Endometriosis   . Hemorrhoid   . Nasal congestion 10/06/2017  . Osteoporosis   . Psoriasis   . Seasonal allergies     Social History   Socioeconomic History  . Marital status: Married    Spouse name: Not on file  . Number of children: Not on file  . Years of education: Not on file  . Highest education level: Not on file  Occupational History  . Not on file  Tobacco Use  . Smoking status: Current Every Day Smoker    Packs/day: 0.75    Types: Cigarettes  . Smokeless tobacco: Never Used  Vaping Use  . Vaping Use: Never used  Substance and Sexual Activity  . Alcohol use: Yes    Alcohol/week: 5.0 standard drinks    Types: 5 Cans of beer per week    Comment: "social"  . Drug use: No  .  Sexual activity: Yes    Birth control/protection: Surgical  Other Topics Concern  . Not on file  Social History Narrative  . Not on file   Social Determinants of Health   Financial Resource Strain: Not on file  Food Insecurity: Not on file  Transportation Needs: Not on file  Physical Activity: Not on file  Stress: Not on file  Social Connections: Not on file  Intimate Partner Violence: Not on file    Past Surgical History:  Procedure Laterality Date  .  ABDOMINAL HYSTERECTOMY  2000   TAH,LSO (RSO IN 2002)  . APPENDECTOMY  1982   LYSIS OF BOWEL ADHESIONS  . COLON SURGERY     "small amount of colon taken at time of oophorectomy in 2002"  . COLONOSCOPY    . COLPOSCOPY    . GYNECOLOGIC CRYOSURGERY    . LIPOMA EXCISION     removed from back and right foot"done at different times"  . LUMBAR DISC SURGERY  06/09/2019  . MASS EXCISION  12/24/2011   Procedure: EXCISION MASS;  Surgeon: Newt Minion, MD;  Location: [facility];  Service: Orthopedics;  Laterality: Right;  Right Foot Excision Soft Tissue Mass  . OOPHORECTOMY  2002   DX LAP W/RSO  . OOPHORECTOMY  2000   TAH W LSO  . PELVIC LAPAROSCOPY     DL  . POLYPECTOMY    . TONSILLECTOMY      Family History  Problem Relation Age of Onset  . Diabetes Mother   . Hypertension Mother   . Lymphoma Father   . Cancer Father   . Lymphoma Paternal Aunt   . Diabetes Maternal Grandmother   . Hypertension Maternal Grandmother   . Heart disease Maternal Grandmother   . Colon polyps Neg Hx   . Crohn's disease Neg Hx   . Esophageal cancer Neg Hx   . Rectal cancer Neg Hx   . Stomach cancer Neg Hx     Allergies  Allergen Reactions  . Codeine Nausea Only    Current Outpatient Medications on File Prior to Visit  Medication Sig Dispense Refill  . albuterol (PROVENTIL HFA;VENTOLIN HFA) 108 (90 Base) MCG/ACT inhaler INHALE 3 PUFFS INTO THE LUNGS EVERY 4 (FOUR) HOURS AS NEEDED FOR WHEEZING OR SHORTNESS OF BREATH. 6.7 Inhaler 0  . ALPRAZolam (XANAX) 1 MG tablet TAKE 1/2 TABLET BY MOUTH 3 TIMES A DAY 45 tablet 1  . aspirin EC 81 MG tablet Take 81 mg by mouth. 1 every 3 -4 days    . atorvastatin (LIPITOR) 40 MG tablet Take 1 tablet (40 mg total) by mouth daily. 90 tablet 3  . Calcium Carbonate-Vitamin D (CALCIUM + D PO) Take 1 tablet by mouth every other day.     . Ixekizumab (TALTZ) 80 MG/ML SOAJ Inject 1 Dose into the skin every 30 (thirty) days.     No current facility-administered medications on  file prior to visit.    There were no vitals taken for this visit.      Objective:   Physical Exam Vitals and nursing note reviewed.  Constitutional:      General: She is not in acute distress.    Appearance: Normal appearance. She is well-developed. She is not ill-appearing.  HENT:     Head: Normocephalic and atraumatic.     Right Ear: Tympanic membrane, ear canal and external ear normal. There is no impacted cerumen.     Left Ear: Tympanic membrane, ear canal and external ear normal. There is  no impacted cerumen.     Nose: Nose normal. No congestion or rhinorrhea.     Mouth/Throat:     Mouth: Mucous membranes are moist.     Pharynx: Oropharynx is clear. No oropharyngeal exudate or posterior oropharyngeal erythema.  Eyes:     General:        Right eye: No discharge.        Left eye: No discharge.     Extraocular Movements: Extraocular movements intact.     Conjunctiva/sclera: Conjunctivae normal.     Pupils: Pupils are equal, round, and reactive to light.  Neck:     Thyroid: No thyromegaly.     Vascular: No carotid bruit.     Trachea: No tracheal deviation.  Cardiovascular:     Rate and Rhythm: Normal rate and regular rhythm.     Pulses: Normal pulses.     Heart sounds: Normal heart sounds. No murmur heard. No friction rub. No gallop.   Pulmonary:     Effort: Pulmonary effort is normal. No respiratory distress.     Breath sounds: Normal breath sounds. No stridor. No wheezing, rhonchi or rales.  Chest:     Chest wall: No tenderness.  Abdominal:     General: Abdomen is flat. Bowel sounds are normal. There is no distension.     Palpations: Abdomen is soft. There is no mass.     Tenderness: There is no abdominal tenderness. There is no right CVA tenderness, left CVA tenderness, guarding or rebound.     Hernia: No hernia is present.  Musculoskeletal:        General: No swelling, tenderness, deformity or signs of injury. Normal range of motion.     Cervical back: Normal  range of motion and neck supple.     Right lower leg: No edema.     Left lower leg: No edema.  Lymphadenopathy:     Cervical: No cervical adenopathy.  Skin:    General: Skin is warm and dry.     Coloration: Skin is not jaundiced or pale.     Findings: No bruising, erythema, lesion or rash.  Neurological:     General: No focal deficit present.     Mental Status: She is alert and oriented to person, place, and time.     Cranial Nerves: No cranial nerve deficit.     Sensory: No sensory deficit.     Motor: No weakness.     Coordination: Coordination normal.     Gait: Gait normal.     Deep Tendon Reflexes: Reflexes normal.  Psychiatric:        Mood and Affect: Mood normal.        Behavior: Behavior normal.        Thought Content: Thought content normal.        Judgment: Judgment normal.       Assessment & Plan:  1. Routine general medical examination at a health care facility - Encouraged to quit smoking  -Courage heart healthy diet and frequent exercise -Follow-up in 1 year or sooner if needed - CBC with Differential/Platelet; Future - Comprehensive metabolic panel; Future - Lipid panel; Future - TSH; Future - TSH - Lipid panel - Comprehensive metabolic panel - CBC with Differential/Platelet  2. Tobacco use - Needs to quit smoking  - albuterol (VENTOLIN HFA) 108 (90 Base) MCG/ACT inhaler; Inhale 3 puffs into the lungs every 4 (four) hours as needed for wheezing or shortness of breath.  Dispense: 6.7 each; Refill: 0  3.  Anxiety - Continue with Xanax as directed  4. Carotid atherosclerosis, unspecified laterality - Continue with Lipitor 40 mg - CBC with Differential/Platelet; Future - Comprehensive metabolic panel; Future - Lipid panel; Future - TSH; Future - TSH - Lipid panel - Comprehensive metabolic panel - CBC with Differential/Platelet  5. Pulmonary emphysema, unspecified emphysema type (HCC)  - albuterol (VENTOLIN HFA) 108 (90 Base) MCG/ACT inhaler; Inhale  3 puffs into the lungs every 4 (four) hours as needed for wheezing or shortness of breath.  Dispense: 6.7 each; Refill: 0   Shirline Frees, NP

## 2020-01-24 ENCOUNTER — Other Ambulatory Visit: Payer: Self-pay | Admitting: Adult Health

## 2020-04-03 DIAGNOSIS — M720 Palmar fascial fibromatosis [Dupuytren]: Secondary | ICD-10-CM | POA: Diagnosis not present

## 2020-04-03 DIAGNOSIS — R2231 Localized swelling, mass and lump, right upper limb: Secondary | ICD-10-CM | POA: Diagnosis not present

## 2020-04-23 ENCOUNTER — Other Ambulatory Visit: Payer: Self-pay | Admitting: Adult Health

## 2020-04-25 NOTE — Telephone Encounter (Signed)
Name of Medication: xanax Name of [facility]acy: [facility] Date and Quantity: 01/24/20 #45 tabs/ 2 refills Last Office Visit and Type: CPE on 01/10/20 Next Office Visit and Type: none schedule

## 2020-05-15 DIAGNOSIS — M25531 Pain in right wrist: Secondary | ICD-10-CM | POA: Diagnosis not present

## 2020-05-15 DIAGNOSIS — M25511 Pain in right shoulder: Secondary | ICD-10-CM | POA: Diagnosis not present

## 2020-05-22 DIAGNOSIS — M5442 Lumbago with sciatica, left side: Secondary | ICD-10-CM | POA: Diagnosis not present

## 2020-05-22 DIAGNOSIS — G8929 Other chronic pain: Secondary | ICD-10-CM | POA: Diagnosis not present

## 2020-05-23 DIAGNOSIS — Z79899 Other long term (current) drug therapy: Secondary | ICD-10-CM | POA: Diagnosis not present

## 2020-05-23 DIAGNOSIS — L400 Psoriasis vulgaris: Secondary | ICD-10-CM | POA: Diagnosis not present

## 2020-05-23 DIAGNOSIS — D1801 Hemangioma of skin and subcutaneous tissue: Secondary | ICD-10-CM | POA: Diagnosis not present

## 2020-05-23 DIAGNOSIS — L409 Psoriasis, unspecified: Secondary | ICD-10-CM | POA: Diagnosis not present

## 2020-05-23 DIAGNOSIS — L814 Other melanin hyperpigmentation: Secondary | ICD-10-CM | POA: Diagnosis not present

## 2020-05-25 DIAGNOSIS — M7582 Other shoulder lesions, left shoulder: Secondary | ICD-10-CM | POA: Diagnosis not present

## 2020-06-05 DIAGNOSIS — M5127 Other intervertebral disc displacement, lumbosacral region: Secondary | ICD-10-CM | POA: Diagnosis not present

## 2020-06-05 DIAGNOSIS — M5442 Lumbago with sciatica, left side: Secondary | ICD-10-CM | POA: Diagnosis not present

## 2020-07-23 ENCOUNTER — Other Ambulatory Visit: Payer: Self-pay | Admitting: Adult Health

## 2020-07-24 NOTE — Telephone Encounter (Signed)
Okay for refill?    LOV01/04/22 CPE   Last Refill 04/25/2020  45    QTY.   2      Refills

## 2020-09-03 ENCOUNTER — Other Ambulatory Visit: Payer: Self-pay

## 2020-09-03 ENCOUNTER — Ambulatory Visit (INDEPENDENT_AMBULATORY_CARE_PROVIDER_SITE_OTHER)
Admission: RE | Admit: 2020-09-03 | Discharge: 2020-09-03 | Disposition: A | Discharge status: Home / Self Care | Payer: 59 | Source: Ambulatory Visit | Attending: Cardiovascular Disease | Admitting: Cardiovascular Disease

## 2020-09-03 DIAGNOSIS — F17210 Nicotine dependence, cigarettes, uncomplicated: Secondary | ICD-10-CM | POA: Diagnosis not present

## 2020-09-03 DIAGNOSIS — Z87891 Personal history of nicotine dependence: Secondary | ICD-10-CM | POA: Diagnosis not present

## 2020-09-03 DIAGNOSIS — R69 Illness, unspecified: Secondary | ICD-10-CM | POA: Diagnosis not present

## 2020-09-11 DIAGNOSIS — M961 Postlaminectomy syndrome, not elsewhere classified: Secondary | ICD-10-CM | POA: Diagnosis not present

## 2020-09-11 DIAGNOSIS — M48061 Spinal stenosis, lumbar region without neurogenic claudication: Secondary | ICD-10-CM | POA: Diagnosis not present

## 2020-09-11 DIAGNOSIS — M5417 Radiculopathy, lumbosacral region: Secondary | ICD-10-CM | POA: Diagnosis not present

## 2020-09-12 NOTE — Progress Notes (Signed)
Please call patient and let them  know their  low dose Ct was read as a Lung RADS 2: nodules that are benign in appearance and behavior with a very low likelihood of becoming a clinically active cancer due to size or lack of growth. Recommendation per radiology is for a repeat LDCT in 12 months. .Please let them  know we will order and schedule their  annual screening scan for 08/2021. Please let them  know there was notation of CAD on their  scan.  Please remind the patient  that this is a non-gated exam therefore degree or severity of disease  cannot be determined. Please have them  follow up with their PCP regarding potential risk factor modification, dietary therapy or pharmacologic therapy if clinically indicated. Pt.  is  currently on statin therapy. Please place order for annual  screening scan for  08/2021 and fax results to PCP. Thanks so much.  + CAD, specifically Coronary atherosclerosis of the right coronary artery and left Circumflex, aortic atherosclerosis, + Statin . No cardiology notes in [place].Please have her follow up with PCP. Thanks so much

## 2020-09-13 ENCOUNTER — Encounter: Payer: Self-pay | Admitting: *Deleted

## 2020-09-13 DIAGNOSIS — F17210 Nicotine dependence, cigarettes, uncomplicated: Secondary | ICD-10-CM

## 2020-09-15 ENCOUNTER — Other Ambulatory Visit: Payer: Self-pay | Admitting: Adult Health

## 2020-10-16 DIAGNOSIS — M5417 Radiculopathy, lumbosacral region: Secondary | ICD-10-CM | POA: Diagnosis not present

## 2020-10-16 DIAGNOSIS — M48061 Spinal stenosis, lumbar region without neurogenic claudication: Secondary | ICD-10-CM | POA: Diagnosis not present

## 2020-10-23 ENCOUNTER — Other Ambulatory Visit: Payer: Self-pay | Admitting: Adult Health

## 2020-10-23 DIAGNOSIS — Z1231 Encounter for screening mammogram for malignant neoplasm of breast: Secondary | ICD-10-CM

## 2020-10-25 NOTE — Telephone Encounter (Signed)
Okay for refill?    LOV 01/10/2020   Last Refill  07/24/2020   45  QTY.  2 Refills

## 2020-11-26 ENCOUNTER — Other Ambulatory Visit: Payer: Self-pay

## 2020-11-26 ENCOUNTER — Ambulatory Visit
Admission: RE | Admit: 2020-11-26 | Discharge: 2020-11-26 | Disposition: A | Discharge status: Home / Self Care | Payer: 59 | Source: Ambulatory Visit | Attending: Adult Health | Admitting: Adult Health

## 2020-11-26 DIAGNOSIS — Z1231 Encounter for screening mammogram for malignant neoplasm of breast: Secondary | ICD-10-CM | POA: Diagnosis not present

## 2020-11-30 ENCOUNTER — Encounter: Payer: Self-pay | Admitting: Internal Medicine

## 2020-12-29 ENCOUNTER — Other Ambulatory Visit: Payer: Self-pay | Admitting: Adult Health

## 2021-03-07 IMAGING — MG DIGITAL SCREENING BILAT W/ CAD
4 series · 4 of 4 positions shown · non-contrast
Comparison: Previous exam(s).

CLINICAL DATA: Screening.

EXAM:
DIGITAL SCREENING BILATERAL MAMMOGRAM WITH CAD

[R CC]
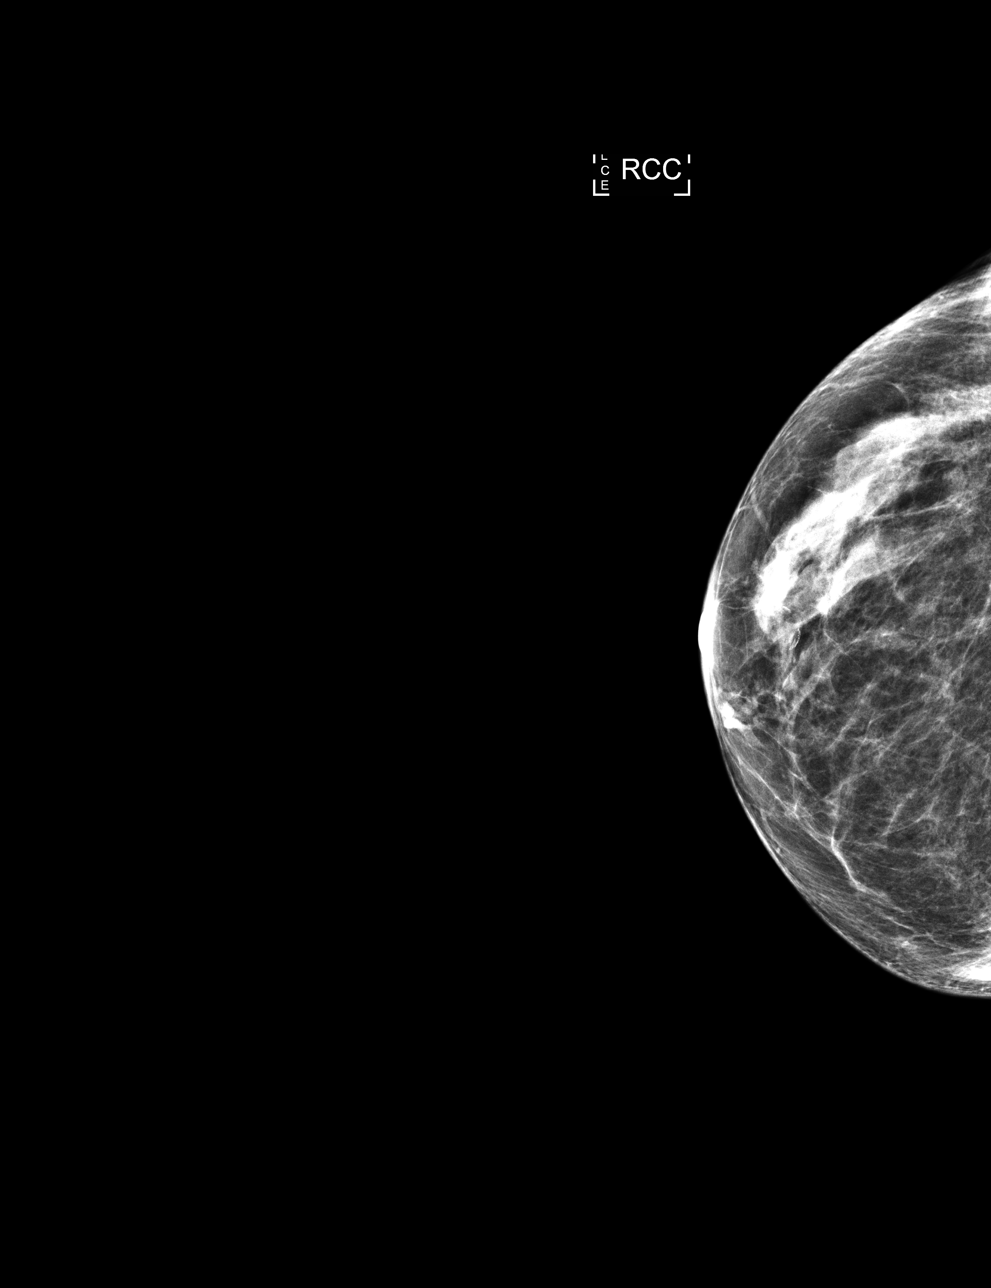

[R MLO]
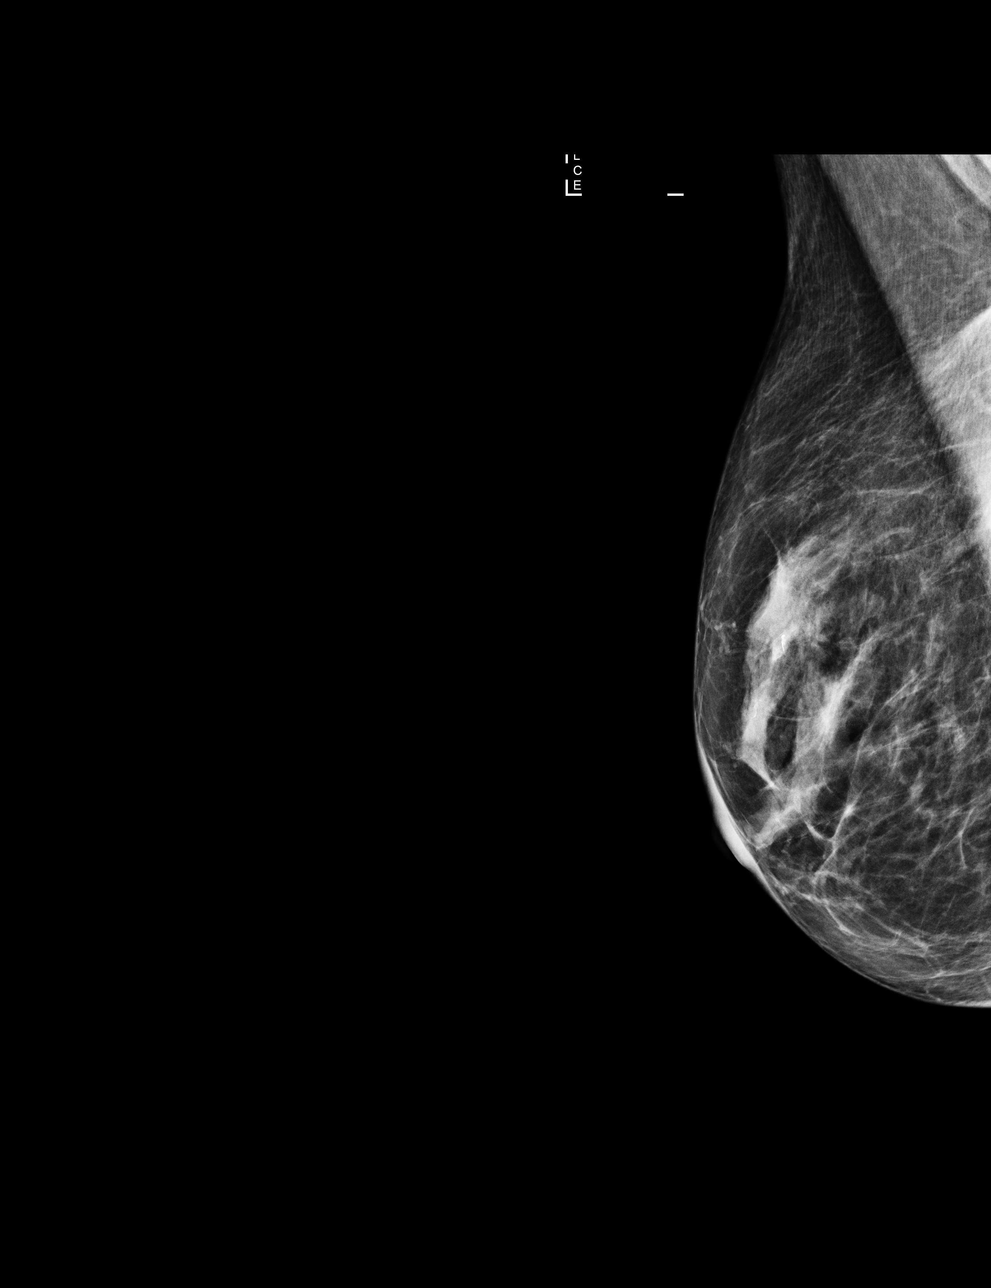

[L MLO]
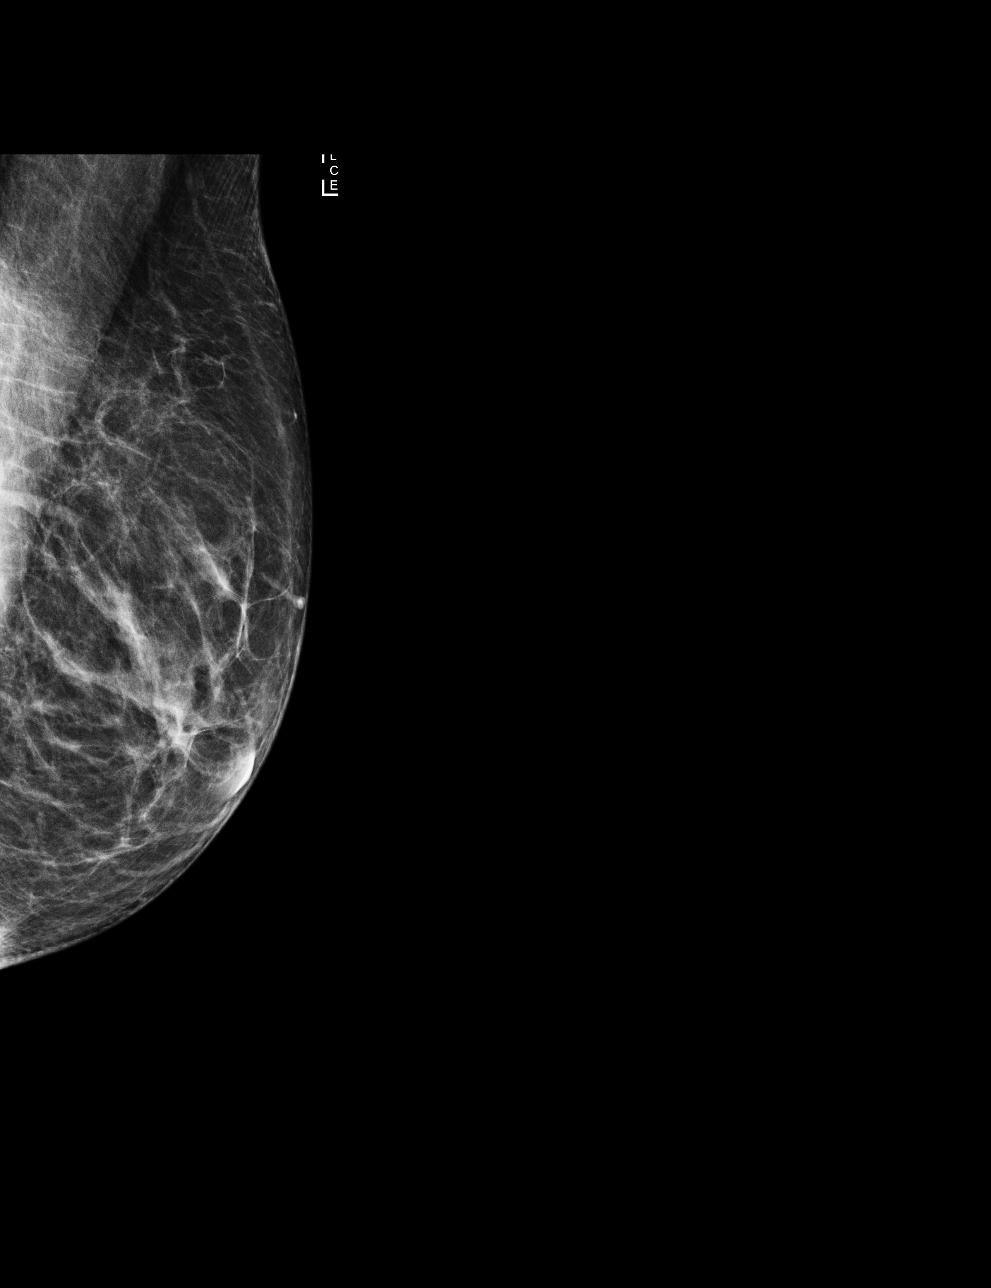

[L CC]
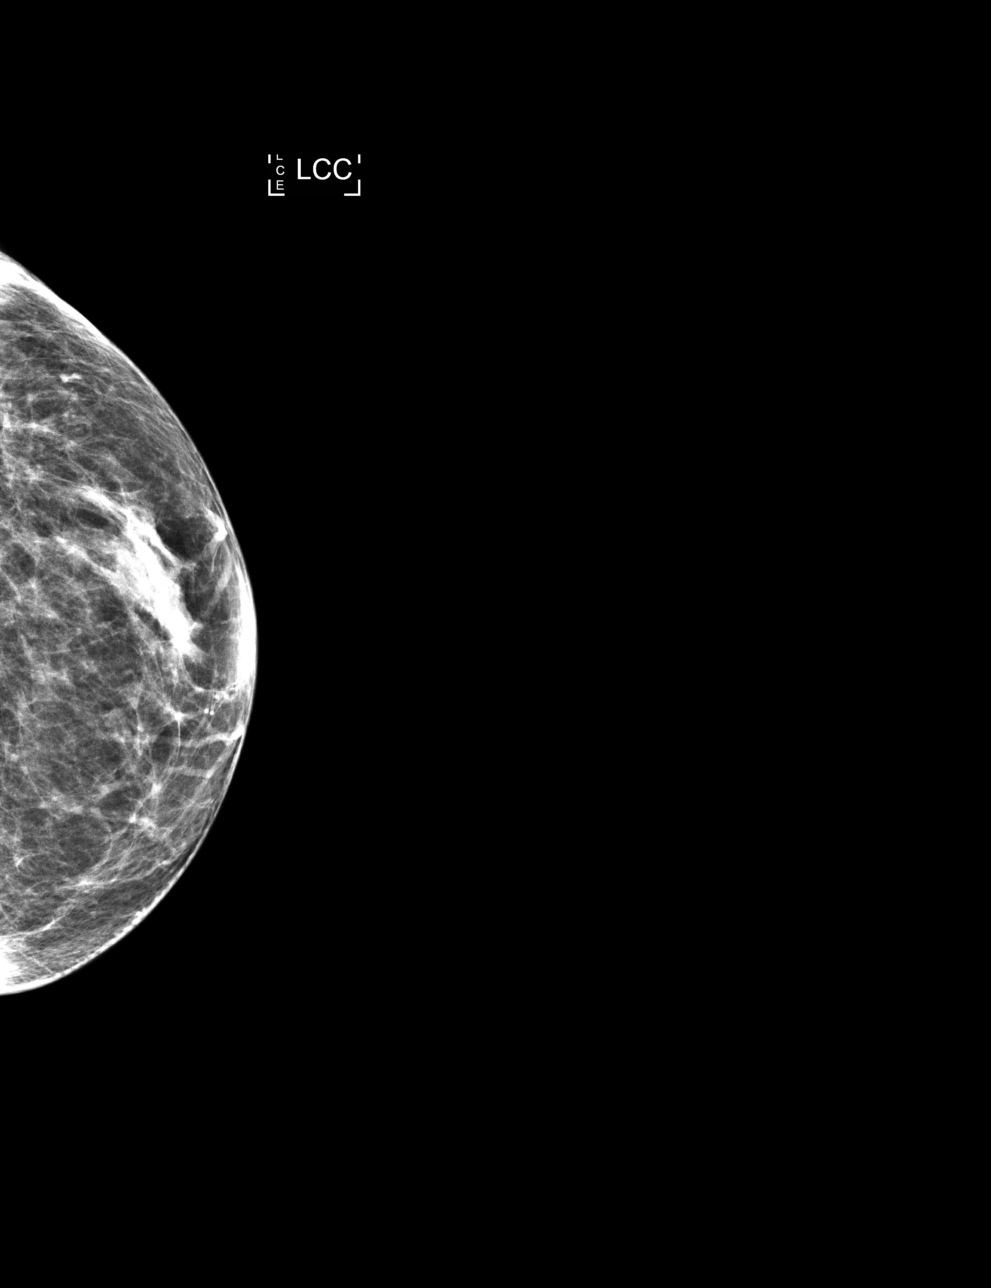

[4 of 4 positions shown; findings below may reference images not displayed]

ACR Breast Density Category b: There are scattered areas of
fibroglandular density.
FINDINGS: There are no findings suspicious for malignancy. Images were
processed with CAD.
IMPRESSION: No mammographic evidence of malignancy. A result letter of this
screening mammogram will be mailed directly to the patient.

RECOMMENDATION:
Screening mammogram in one year. (Code:AS-G-LCT)

BI-RADS CATEGORY  1: Negative.

## 2021-05-01 ENCOUNTER — Other Ambulatory Visit: Payer: Self-pay | Admitting: Adult Health

## 2021-05-03 NOTE — Telephone Encounter (Signed)
Okay for refill?  ? ? ?LOV ?01/09/2021 ? ?Last Refill        ? ?ALPRAZolam (XANAX) 1 MG tablet 45 tablet 2 01/01/2021   ?Sig:   TAKE 1/2 TABLET BY MOUTH 3 TIMES A DAY      ?Route:   (none)      ? ?

## 2021-05-16 DIAGNOSIS — L404 Guttate psoriasis: Secondary | ICD-10-CM | POA: Diagnosis not present

## 2021-05-16 DIAGNOSIS — Z5181 Encounter for therapeutic drug level monitoring: Secondary | ICD-10-CM | POA: Diagnosis not present

## 2021-06-20 DIAGNOSIS — L404 Guttate psoriasis: Secondary | ICD-10-CM | POA: Diagnosis not present

## 2021-07-13 IMAGING — US US CAROTID DUPLEX BILAT
1 series · 13 of 24 positions shown · non-contrast
Comparison: None.

CLINICAL DATA: Atherosclerosis of the carotid arteries.

EXAM:
BILATERAL CAROTID DUPLEX ULTRASOUND
TECHNIQUE: Gray scale imaging, color Doppler and duplex ultrasound were
performed of bilateral carotid and vertebral arteries in the neck.

[Series 1: us carotid duplex bilat · 0.06mm/px · 13 of 73 slices shown]
[im 1/73]
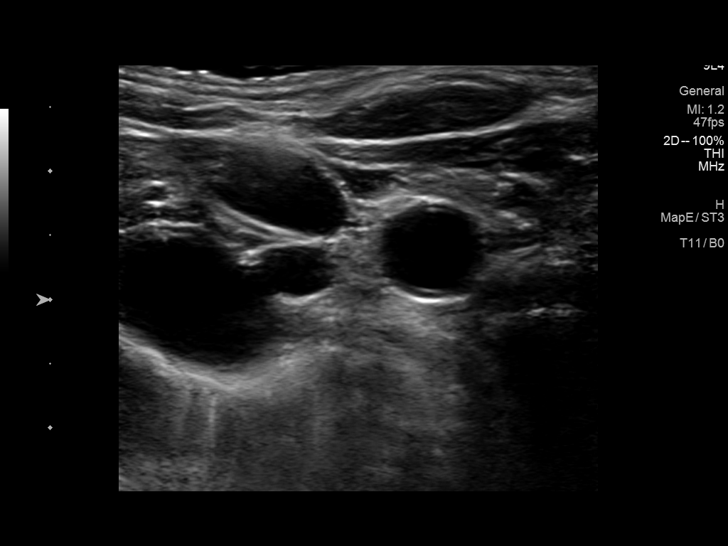
[im 7/73]
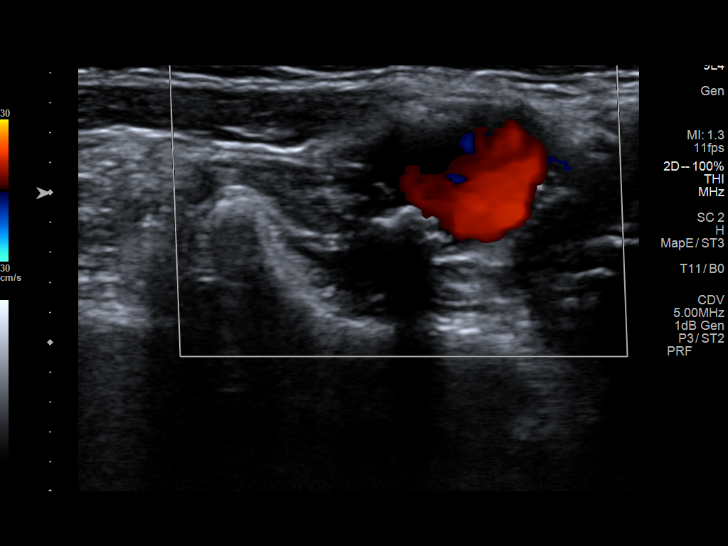
[im 13/73]
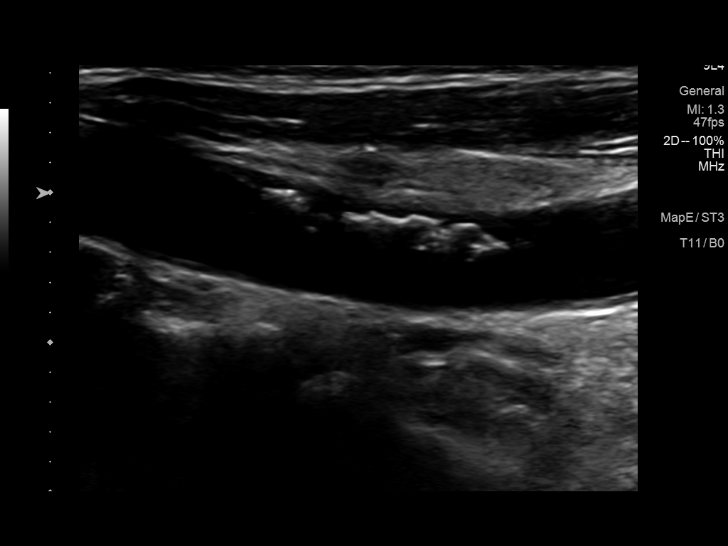
[im 19/73]
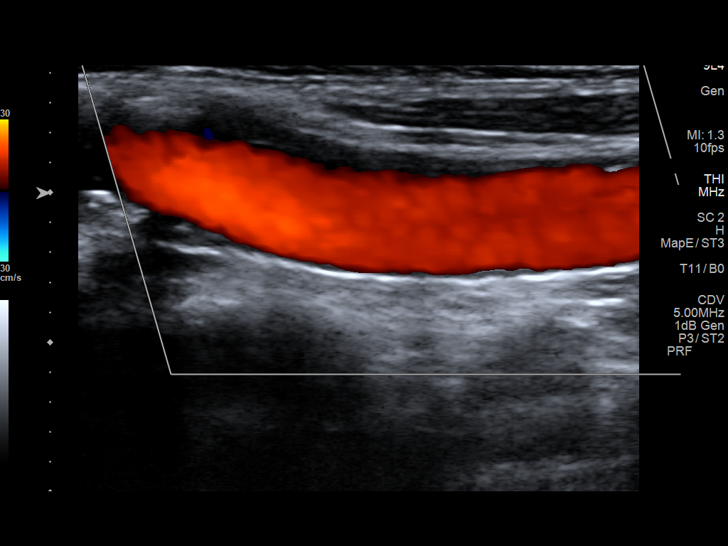
[im 26/73]
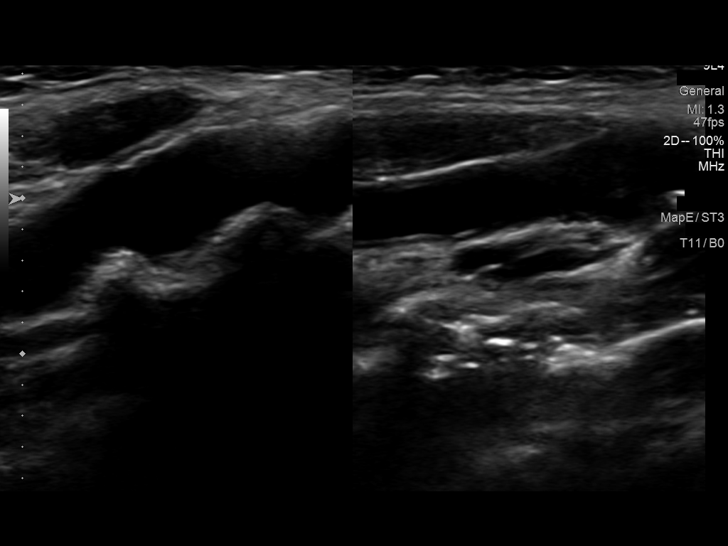
[im 32/73]
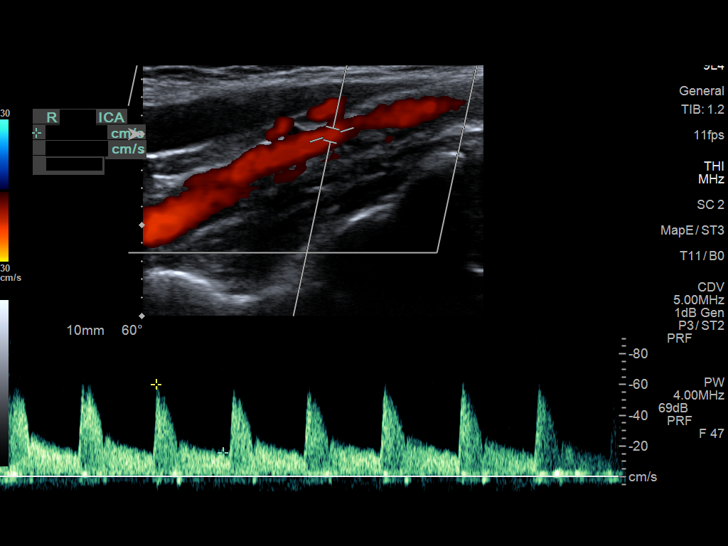
[im 38/73]
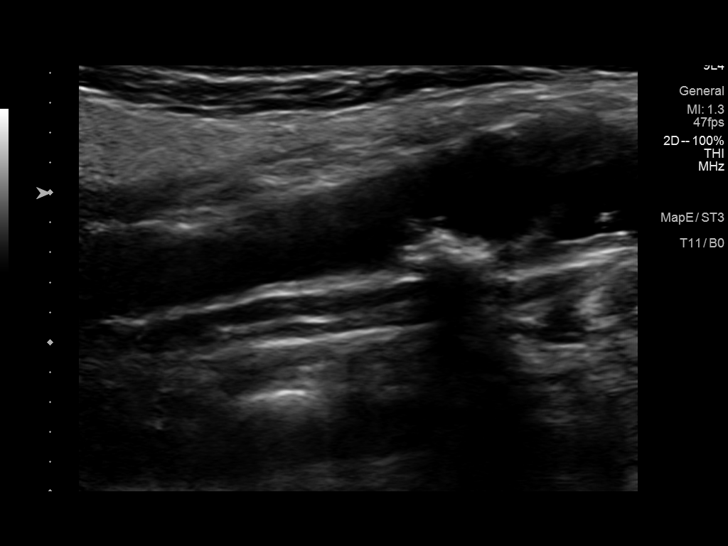
[im 41/73]
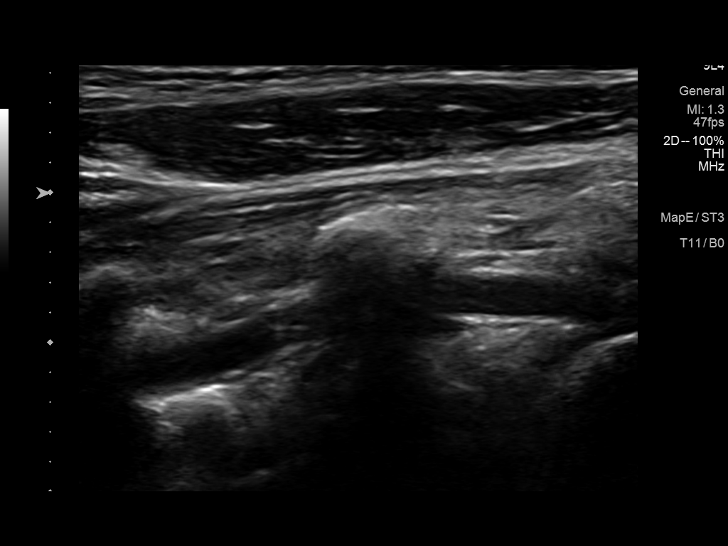
[im 47/73]
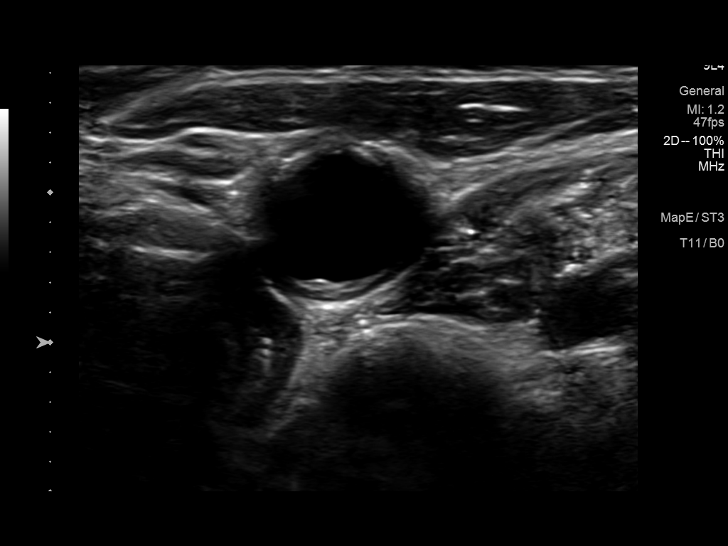
[im 54/73]
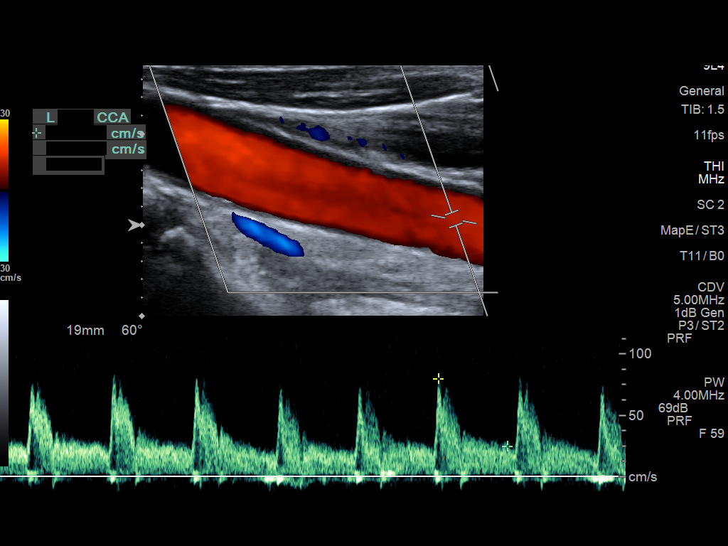
[im 60/73]
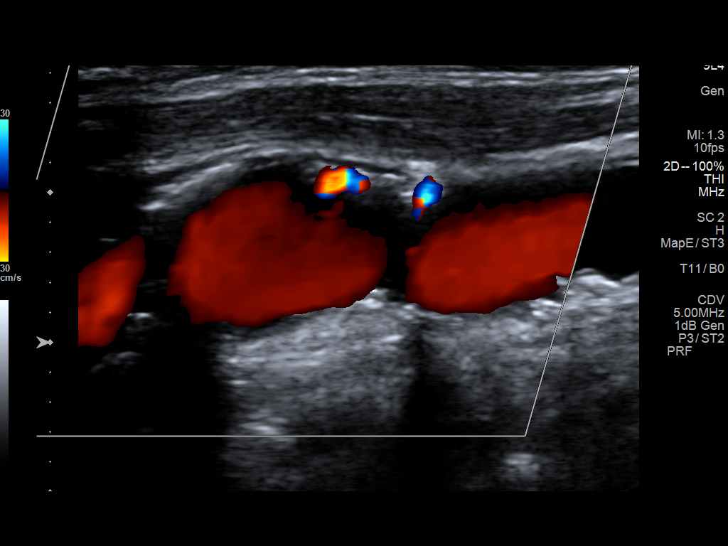
[im 66/73]
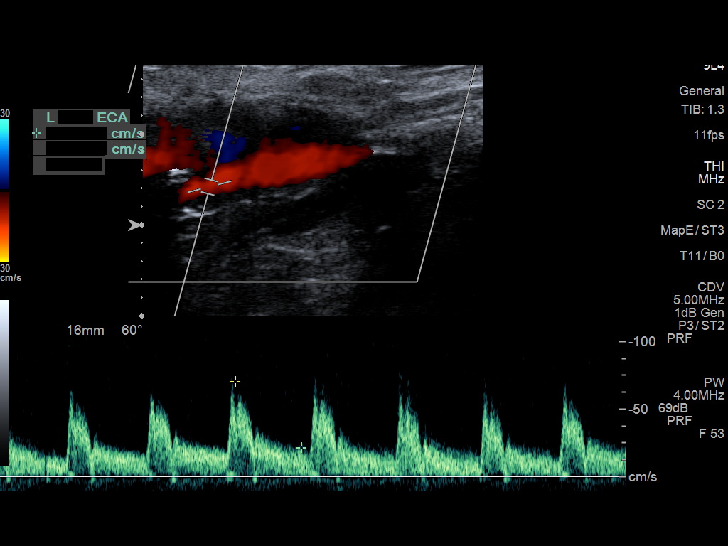
[im 73/73]
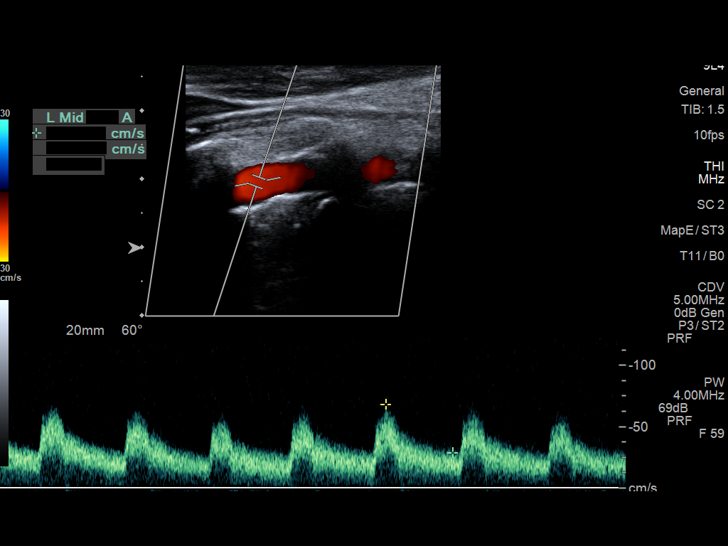

[13 of 24 positions shown; findings below may reference images not displayed]

FINDINGS: Criteria: Quantification of carotid stenosis is based on velocity
parameters that correlate the residual internal carotid diameter
with NASCET-based stenosis levels, using the diameter of the distal
internal carotid lumen as the denominator for stenosis measurement.

The following velocity measurements were obtained:

RIGHT

ICA: 93 cm/sec

CCA: 77 cm/sec

SYSTOLIC ICA/CCA RATIO:

ECA: 56 cm/sec

LEFT

ICA: 102 cm/sec

CCA: 80 cm/sec

SYSTOLIC ICA/CCA RATIO:

ECA: 71 cm/sec

RIGHT CAROTID ARTERY: Calcified plaque is noted throughout the right
CCA resulting in less than 50% stenosis. There is calcified plaque
throughout the right carotid bulb resulting in less than 50%
stenosis. There is calcified and noncalcified plaque throughout the
proximal right ICA resulting in around 50% stenosis by grayscale
imaging.

RIGHT VERTEBRAL ARTERY:  Antegrade flow is noted

LEFT CAROTID ARTERY: There is calcified plaque throughout the left
CCA resulting in less than 50% stenosis. There is calcified and
noncalcified plaque within the left carotid bulb resulting in less
than 50% stenosis. There is moderate stenosis at the origin of the
left ECA. There is spectral broadening of the left ICA waveform.
There is atherosclerotic plaque within the proximal left ICA
resulting in around 50% stenosis by grayscale imaging.

LEFT VERTEBRAL ARTERY:  Antegrade flow is noted.

Upper extremity blood pressures: RIGHT: 121/83 LEFT: 136/71
IMPRESSION: 1. Calcified plaque bilaterally resulting in less than 50% stenosis
by peak systolic velocity measurements of the bilateral ICAs.
However, by grayscale imaging, the degree of stenosis is estimated
to be around 50% bilaterally.
2. Antegrade flow is noted within both vertebral arteries.

## 2021-08-07 ENCOUNTER — Other Ambulatory Visit: Payer: Self-pay | Admitting: Adult Health

## 2021-09-03 ENCOUNTER — Ambulatory Visit (HOSPITAL_BASED_OUTPATIENT_CLINIC_OR_DEPARTMENT_OTHER): Payer: 59

## 2021-09-05 ENCOUNTER — Emergency Department (HOSPITAL_COMMUNITY)
Admission: EM | Admit: 2021-09-05 | Discharge: 2021-09-05 | Disposition: A | Discharge status: Home / Self Care | Payer: 59 | Attending: Emergency Medicine | Admitting: Emergency Medicine

## 2021-09-05 ENCOUNTER — Other Ambulatory Visit: Payer: Self-pay

## 2021-09-05 ENCOUNTER — Encounter (HOSPITAL_COMMUNITY): Payer: Self-pay

## 2021-09-05 DIAGNOSIS — Z2914 Encounter for prophylactic rabies immune globin: Secondary | ICD-10-CM | POA: Insufficient documentation

## 2021-09-05 DIAGNOSIS — W5501XD Bitten by cat, subsequent encounter: Secondary | ICD-10-CM | POA: Diagnosis not present

## 2021-09-05 DIAGNOSIS — S61451D Open bite of right hand, subsequent encounter: Secondary | ICD-10-CM | POA: Diagnosis not present

## 2021-09-05 DIAGNOSIS — S61256D Open bite of right little finger without damage to nail, subsequent encounter: Secondary | ICD-10-CM | POA: Insufficient documentation

## 2021-09-05 DIAGNOSIS — Z23 Encounter for immunization: Secondary | ICD-10-CM | POA: Insufficient documentation

## 2021-09-05 DIAGNOSIS — Z203 Contact with and (suspected) exposure to rabies: Secondary | ICD-10-CM | POA: Diagnosis not present

## 2021-09-05 DIAGNOSIS — S6991XD Unspecified injury of right wrist, hand and finger(s), subsequent encounter: Secondary | ICD-10-CM | POA: Diagnosis present

## 2021-09-05 MED ORDER — RABIES IMMUNE GLOBULIN 150 UNIT/ML IM INJ
20.0000 [IU]/kg | INJECTION | Freq: Once | INTRAMUSCULAR | Status: AC
  Administered 2021-09-05: 975 [IU] via INTRAMUSCULAR
  Filled 2021-09-05: qty 8

## 2021-09-05 MED ORDER — RABIES VACCINE, PCEC IM SUSR
1.0000 mL | Freq: Once | INTRAMUSCULAR | Status: AC
  Administered 2021-09-05: 1 mL via INTRAMUSCULAR
  Filled 2021-09-05: qty 1

## 2021-09-05 NOTE — Discharge Instructions (Signed)
                                  RABIES VACCINE FOLLOW UP  Patient's Name: Susan Aguirre                     Original Order Date:09/05/2021  Medical Record Number: [id number]  ED Physician: Pattricia Boss, MD Primary Diagnosis: Rabies Exposure       PCP: Dorothyann Peng, NP  Patient Phone Number: (home) [phone] (home)    (cell)  Telephone Information:  Mobile [phone]    (work) There is no work phone number on file. Species of Animal:     You have been seen in the Emergency Department for a possible rabies exposure. It's very important you return for the additional vaccine doses.  Please call the clinic listed below for hours of operation.   Clinic that will administer your rabies vaccines:    DAY 0:  09/05/2021      DAY 3:  09/08/2021       DAY 7:  09/12/2021     DAY 14:  09/19/2021         The 5th vaccine injection is considered for immune compromised patients only.  DAY 28:  10/03/2021

## 2021-09-05 NOTE — ED Triage Notes (Signed)
Patient was bitten by feral cat she was feeding on 8/24 and went to PCP on 8/25 was given antibiotics and has completed them but health department told her to come to ER for rabies vaccine and immunoglobulin

## 2021-09-05 NOTE — ED Provider Notes (Signed)
[facility] EMERGENCY DEPARTMENT Provider Note   CSN: [id number] Arrival date & time: 09/05/21  1419     History  Chief Complaint  Patient presents with   Animal Bite    Susan Aguirre is a 61 y.o. female.   Animal Bite    Patient with medical history of anxiety, tobacco dependence presents today due to cat bite.  She was bit by a feral cat on the fifth digit of her right hand on 08/29/2021.  Seen by primary/25.  She is been taking Augmentin twice daily since then and her tetanus was updated at that time.  Given the viral nature of the cat she was called today by the health department/animal control and told to come to the emergency department for rabies vaccine and immunoglobulin.  Patient has never had the rabies vaccination before, she has some pain to the digit where she was bit but she is not having any spreading redness, discharge and she is able to use the hand without any significant difficulty.  Home Medications Prior to Admission medications   Medication Sig Start Date End Date Taking? Authorizing Provider  albuterol (VENTOLIN HFA) 108 (90 Base) MCG/ACT inhaler Inhale 3 puffs into the lungs every 4 (four) hours as needed for wheezing or shortness of breath. 01/10/20   Nafziger, Tommi Rumps, NP  ALPRAZolam Duanne Moron) 1 MG tablet TAKE 1/2 TABLET BY MOUTH 3 TIMES A DAY 08/07/21   Nafziger, Tommi Rumps, NP  atorvastatin (LIPITOR) 40 MG tablet TAKE 1 TABLET BY MOUTH EVERY DAY 08/07/21   Nafziger, Tommi Rumps, NP  Calcium Carbonate-Vitamin D (CALCIUM + D PO) Take 1 tablet by mouth every other day.    [provider]  SKYRIZI PEN 150 MG/ML SOAJ  12/28/19   [provider]      Allergies    Codeine and Morphine and related    Review of Systems   Review of Systems  Physical Exam Updated Vital Signs BP (!) 159/90 (BP Location: Right Arm)   Pulse 72   Temp 99.3 F (37.4 C) (Oral)   Resp 15   Ht '5\' 6"'$  (1.676 m)   Wt 48 kg   SpO2 99%   BMI 17.08 kg/m   Physical Exam Vitals and nursing note reviewed. Exam conducted with a chaperone present.  Constitutional:      General: She is not in acute distress.    Appearance: Normal appearance.  HENT:     Head: Normocephalic and atraumatic.  Eyes:     General: No scleral icterus.    Extraocular Movements: Extraocular movements intact.     Pupils: Pupils are equal, round, and reactive to light.  Cardiovascular:     Rate and Rhythm: Normal rate and regular rhythm.     Pulses: Normal pulses.  Musculoskeletal:        General: No swelling or tenderness. Normal range of motion.     Comments: No tenderness, puncture wound apparently healed as it is not able to be visualized to the right pinky.  Full ROM without any difficulty or crepitus.  Skin:    Capillary Refill: Capillary refill takes less than 2 seconds.     Coloration: Skin is not jaundiced.     Comments: No fluctuance, erythema.  No streaking to the upper extremity.  Neurological:     Mental Status: She is alert. Mental status is at baseline.     Coordination: Coordination normal.     ED Results / Procedures / Treatments   Labs (all  labs ordered are listed, but only abnormal results are displayed) Labs Reviewed - No data to display  EKG None  Radiology No results found.  Procedures Procedures    Medications Ordered in ED Medications  rabies immune globulin (HYPERRAB/KEDRAB) injection 975 Units (975 Units Intramuscular Given 09/05/21 1613)  rabies vaccine (RABAVERT) injection 1 mL (1 mL Intramuscular Given 09/05/21 1612)    ED Course/ Medical Decision Making/ A&P                           Medical Decision Making Risk Prescription drug management.   Patient presents due to cat bite.    She was given Augmentin which she is currently taking, tetanus also updated on the 08/30/2021.  She is neurovascular intact.  She denies any systemic symptoms, there is no bony tenderness, no signs of cellulitis on exam. SIRS negative, does  not appear septic. Given full ROM I do not think there is any flexor tenosynovitis.  Considered imaging but do not think would be beneficial.    -BP (!) 159/90 (BP Location: Right Arm)   Pulse 72   Temp 99.3 F (37.4 C) (Oral)   Resp 15   Ht '5\' 6"'$  (1.676 m)   Wt 48 kg   SpO2 99%   BMI 17.08 kg/m   Patient presents today due to health department telling her she needs a rabies vaccination series.  We discussed risk and benefits and she would like to proceed.  Rabies administered without any complication.  Return precautions were discussed with the patient who verbalized understanding.        Final Clinical Impression(s) / ED Diagnoses Final diagnoses:  Cat bite, subsequent encounter  Need for rabies vaccination    Rx / DC Orders ED Discharge Orders     None         Sherrill Raring, PA-C 09/05/21 Yevette Edwards    Tretha Sciara, MD 09/05/21 [phone]

## 2021-09-08 ENCOUNTER — Ambulatory Visit (HOSPITAL_COMMUNITY)
Admission: EM | Admit: 2021-09-08 | Discharge: 2021-09-08 | Disposition: A | Discharge status: Home / Self Care | Payer: 59 | Attending: Internal Medicine | Admitting: Internal Medicine

## 2021-09-08 DIAGNOSIS — Z203 Contact with and (suspected) exposure to rabies: Secondary | ICD-10-CM

## 2021-09-08 MED ORDER — RABIES VACCINE, PCEC IM SUSR
INTRAMUSCULAR | Status: AC
  Filled 2021-09-08: qty 1

## 2021-09-08 MED ORDER — RABIES VACCINE, PCEC IM SUSR
1.0000 mL | Freq: Once | INTRAMUSCULAR | Status: AC
  Administered 2021-09-08: 1 mL via INTRAMUSCULAR

## 2021-09-08 NOTE — ED Notes (Signed)
Patient presenting for day 3 rabies vaccine. Given in the left deltoid. Patient tolerated well.

## 2021-09-12 ENCOUNTER — Ambulatory Visit (HOSPITAL_COMMUNITY)
Admission: RE | Admit: 2021-09-12 | Discharge: 2021-09-12 | Disposition: A | Discharge status: Home / Self Care | Payer: 59 | Source: Ambulatory Visit | Attending: Internal Medicine | Admitting: Internal Medicine

## 2021-09-12 DIAGNOSIS — Z203 Contact with and (suspected) exposure to rabies: Secondary | ICD-10-CM

## 2021-09-12 MED ORDER — RABIES VACCINE, PCEC IM SUSR
INTRAMUSCULAR | Status: AC
  Filled 2021-09-12: qty 1

## 2021-09-12 MED ORDER — RABIES VACCINE, PCEC IM SUSR
1.0000 mL | Freq: Once | INTRAMUSCULAR | Status: AC
  Administered 2021-09-12: 1 mL via INTRAMUSCULAR

## 2021-09-12 NOTE — ED Notes (Signed)
Pt came to clinic today for 3rd rabies vaccine. She tolerated vaccine well and knows date of final vaccine.

## 2021-09-19 ENCOUNTER — Encounter (HOSPITAL_COMMUNITY): Payer: Self-pay

## 2021-09-19 ENCOUNTER — Ambulatory Visit (HOSPITAL_COMMUNITY)
Admission: RE | Admit: 2021-09-19 | Discharge: 2021-09-19 | Disposition: A | Discharge status: Home / Self Care | Payer: 59 | Source: Ambulatory Visit | Attending: Internal Medicine | Admitting: Internal Medicine

## 2021-09-19 DIAGNOSIS — Z203 Contact with and (suspected) exposure to rabies: Secondary | ICD-10-CM | POA: Diagnosis not present

## 2021-09-19 MED ORDER — RABIES VACCINE, PCEC IM SUSR
1.0000 mL | Freq: Once | INTRAMUSCULAR | Status: AC
  Administered 2021-09-19: 1 mL via INTRAMUSCULAR

## 2021-09-19 MED ORDER — RABIES VACCINE, PCEC IM SUSR
INTRAMUSCULAR | Status: AC
  Filled 2021-09-19: qty 1

## 2021-09-19 NOTE — ED Triage Notes (Addendum)
Patient presents for last rabies vaccination (4th vaccine) today.   Patient has no complaints.

## 2021-09-25 DIAGNOSIS — R69 Illness, unspecified: Secondary | ICD-10-CM | POA: Diagnosis not present

## 2021-10-02 ENCOUNTER — Telehealth: Payer: Self-pay | Admitting: Acute Care

## 2021-10-02 DIAGNOSIS — Z87891 Personal history of nicotine dependence: Secondary | ICD-10-CM

## 2021-10-02 DIAGNOSIS — F17210 Nicotine dependence, cigarettes, uncomplicated: Secondary | ICD-10-CM

## 2021-10-02 DIAGNOSIS — Z122 Encounter for screening for malignant neoplasm of respiratory organs: Secondary | ICD-10-CM

## 2021-10-02 NOTE — Telephone Encounter (Signed)
I have received a faxed report with the results of this patient's LDCT. This was done at an outside hospital per insurance contracting with out patient imaging centers outside the [facility]. As a result, we do not have access to the actual images, only the dictated  report as interpreted by the radiologist at the outside facility. Therefore all interpretation was done by the outside hospitals radiologist. We cannot be responsible for imaging interpretation of images we cannot see to confirm accuracy.   Scan was read as a Lung RADS 1, negative study: no nodules or definitely benign nodules. Radiology recommendation is for a repeat LDCT in 12 months.   Pt. Does have moderate CA Calcifications She is on statin medication   Langley Gauss, please send patient a letter, let her know that the radiologist at [facility] read her scan as a LR 1, no suspicious lung nodules.  There is notation of moderate CAD, please let her know and have her follow up with PCP to manage as they feel is clinically indicated.   12 month annual follow up scan Fax results to PCP. ( I will put the paper copy of the fax I received on your desk to allow you to fax the one copy we have access to)

## 2021-10-04 ENCOUNTER — Encounter: Payer: Self-pay | Admitting: *Deleted

## 2021-10-04 NOTE — Telephone Encounter (Signed)
CT results faxed to PCP. Order placed for 12 mth f/u lung screening CT. Result letter sent to pt via MyChart.

## 2021-11-12 ENCOUNTER — Other Ambulatory Visit: Payer: Self-pay | Admitting: Adult Health

## 2021-11-13 NOTE — Telephone Encounter (Signed)
Okay for refill?  

## 2021-11-27 ENCOUNTER — Other Ambulatory Visit: Payer: Self-pay | Admitting: Adult Health

## 2021-11-27 DIAGNOSIS — Z1231 Encounter for screening mammogram for malignant neoplasm of breast: Secondary | ICD-10-CM

## 2021-12-08 ENCOUNTER — Other Ambulatory Visit: Payer: Self-pay | Admitting: Adult Health

## 2022-01-13 ENCOUNTER — Other Ambulatory Visit: Payer: Self-pay | Admitting: Adult Health

## 2022-01-28 ENCOUNTER — Ambulatory Visit
Admission: RE | Admit: 2022-01-28 | Discharge: 2022-01-28 | Disposition: A | Discharge status: Home / Self Care | Payer: 59 | Source: Ambulatory Visit | Attending: Adult Health | Admitting: Adult Health

## 2022-01-28 DIAGNOSIS — Z1231 Encounter for screening mammogram for malignant neoplasm of breast: Secondary | ICD-10-CM

## 2022-01-29 ENCOUNTER — Encounter: Payer: Self-pay | Admitting: Adult Health

## 2022-01-29 ENCOUNTER — Ambulatory Visit (INDEPENDENT_AMBULATORY_CARE_PROVIDER_SITE_OTHER): Payer: 59 | Admitting: Adult Health

## 2022-01-29 VITALS — BP 128/88 | HR 59 | Temp 98.0°F | Ht 64.0 in | Wt 95.0 lb

## 2022-01-29 DIAGNOSIS — J439 Emphysema, unspecified: Secondary | ICD-10-CM

## 2022-01-29 DIAGNOSIS — L409 Psoriasis, unspecified: Secondary | ICD-10-CM

## 2022-01-29 DIAGNOSIS — R634 Abnormal weight loss: Secondary | ICD-10-CM

## 2022-01-29 DIAGNOSIS — I2510 Atherosclerotic heart disease of native coronary artery without angina pectoris: Secondary | ICD-10-CM

## 2022-01-29 DIAGNOSIS — Z0000 Encounter for general adult medical examination without abnormal findings: Secondary | ICD-10-CM | POA: Diagnosis not present

## 2022-01-29 DIAGNOSIS — Z1159 Encounter for screening for other viral diseases: Secondary | ICD-10-CM

## 2022-01-29 DIAGNOSIS — Z114 Encounter for screening for human immunodeficiency virus [HIV]: Secondary | ICD-10-CM

## 2022-01-29 DIAGNOSIS — I2584 Coronary atherosclerosis due to calcified coronary lesion: Secondary | ICD-10-CM | POA: Diagnosis not present

## 2022-01-29 DIAGNOSIS — Z720 Tobacco use: Secondary | ICD-10-CM | POA: Diagnosis not present

## 2022-01-29 DIAGNOSIS — F4323 Adjustment disorder with mixed anxiety and depressed mood: Secondary | ICD-10-CM | POA: Diagnosis not present

## 2022-01-29 MED ORDER — MIRTAZAPINE 7.5 MG PO TABS: 7.5000 mg | ORAL_TABLET | Freq: Every day | ORAL | 0 refills | Status: DC

## 2022-01-29 NOTE — Patient Instructions (Signed)
It was great seeing you today   We will follow up with you regarding your lab work   Please follow up in 3 months to see how your weight is doing   Please call [facility] and schedule your colonoscopy

## 2022-01-29 NOTE — Progress Notes (Addendum)
Subjective:    Patient ID: Susan Aguirre, female    DOB: 1960-06-18, 62 y.o.   MRN: [id number]  HPI  Patient presents for yearly preventative medicine examination. He is a pleasant 62 year old female who  has a past medical history of Allergy, Anemia, Anxiety, Arthritis, Asthma, Calcification of aorta (HCC), CIN I (cervical intraepithelial neoplasia I), Depression, Diverticulosis, Dysplastic colon polyp, Endometriosis, Hemorrhoid, Nasal congestion (10/06/2017), Osteoporosis, Psoriasis, and Seasonal allergies.  Anxiety -Xanax 0.5 mg 3 times daily. She feels well controlled on this medication  Tobacco Use -continues to smoke between a half a pack and 1 pack a day. She uses albuterol inhaler as needed for emphysema. Her last low dose CT done in 2023 showed no suspicious lung nodules.  Also showed aortic atherosclerosis and emphysema. She knows she needs to quit.  COPD -uses her inhaler maybe once a week.  Does not feel short of breath with exertion.  Aortic atherosclerosis-prescribe Lipitor 40 mg daily Lab Results  Component Value Date   CHOL 128 01/10/2020   HDL 42.60 01/10/2020   LDLCALC 65 01/10/2020   TRIG 103.0 01/10/2020   CHOLHDL 3 01/10/2020   Psoriasis - managed by Dermatology with Stelera 45 mg.   Unintentional weight loss -she reports decreased appetite for an unknown amount of time.  Per patient she will eat a large breakfast which she has always done and then will not be hungry throughout the afternoon and evening.  She denies nausea, vomiting, diarrhea, fevers, or chills.  She has not had any abdominal pain.  Wt Readings from Last 3 Encounters:  01/29/22 95 lb (43.1 kg)  09/05/21 105 lb 13.1 oz (48 kg)  01/10/20 106 lb (48.1 kg)   All immunizations and health maintenance protocols were reviewed with the patient and needed orders were placed.  Appropriate screening laboratory values were ordered for the patient including screening of hyperlipidemia, renal  function and hepatic function.   Medication reconciliation,  past medical history, social history, problem list and allergies were reviewed in detail with the patient  Goals were established with regard to weight loss, exercise, and  diet in compliance with medications Wt Readings from Last 3 Encounters:  01/29/22 95 lb (43.1 kg)  09/05/21 105 lb 13.1 oz (48 kg)  01/10/20 106 lb (48.1 kg)   She is overdue for three year follow up on colon cancer screening.  She is up to date on routine mammograms   Review of Systems  Constitutional:  Positive for unexpected weight change.  HENT: Negative.    Eyes: Negative.   Respiratory: Negative.    Cardiovascular: Negative.   Gastrointestinal: Negative.   Endocrine: Negative.   Genitourinary: Negative.   Musculoskeletal: Negative.   Skin: Negative.   Allergic/Immunologic: Negative.   Neurological: Negative.   Hematological: Negative.   Psychiatric/Behavioral: Negative.     Past Medical History:  Diagnosis Date   Allergy    Anemia    "borderline"   Anxiety    Arthritis    Asthma    "bronchial asthmatic, flares up seasonally"   Calcification of aorta (HCC)    Abdominal aorta   CIN I (cervical intraepithelial neoplasia I)    Depression    Diverticulosis    Dysplastic colon polyp    Endometriosis    Hemorrhoid    Nasal congestion 10/06/2017   Osteoporosis    Psoriasis    Seasonal allergies     Social History   Socioeconomic History   Marital status: Married  Spouse name: Not on file   Number of children: Not on file   Years of education: Not on file   Highest education level: Not on file  Occupational History   Not on file  Tobacco Use   Smoking status: Every Day    Packs/day: 0.75    Types: Cigarettes   Smokeless tobacco: Never  Vaping Use   Vaping Use: Never used  Substance and Sexual Activity   Alcohol use: Yes    Alcohol/week: 5.0 standard drinks of alcohol    Types: 5 Cans of beer per week    Comment:  "social"   Drug use: No   Sexual activity: Yes    Birth control/protection: Surgical  Other Topics Concern   Not on file  Social History Narrative   Not on file   Social Determinants of Health   Financial Resource Strain: Not on file  Food Insecurity: Not on file  Transportation Needs: Not on file  Physical Activity: Not on file  Stress: Not on file  Social Connections: Not on file  Intimate Partner Violence: Not on file    Past Surgical History:  Procedure Laterality Date   ABDOMINAL HYSTERECTOMY  2000   TAH,LSO (RSO IN 2002)   [place]     "small amount of colon taken at time of oophorectomy in 2002"   COLONOSCOPY     [place]     removed from back and right foot"done at different times"   [place]  06/09/2019   MASS EXCISION  12/24/2011   Procedure: EXCISION MASS;  Surgeon: Newt Minion, MD;  Location: [facility];  Service: Orthopedics;  Laterality: Right;  Right Foot Excision Soft Tissue Mass   OOPHORECTOMY  2002   DX LAP W/RSO   OOPHORECTOMY  2000   TAH W LSO   PELVIC LAPAROSCOPY     DL   POLYPECTOMY     TONSILLECTOMY      Family History  Problem Relation Age of Onset   Diabetes Mother    Hypertension Mother    Lymphoma Father    Cancer Father    Lymphoma Paternal Aunt    Diabetes Maternal Grandmother    Hypertension Maternal Grandmother    Heart disease Maternal Grandmother    Colon polyps Neg Hx    Crohn's disease Neg Hx    Esophageal cancer Neg Hx    Rectal cancer Neg Hx    Stomach cancer Neg Hx     Allergies  Allergen Reactions   Codeine Nausea Only   Morphine And Related Nausea Only    Current Outpatient Medications on File Prior to Visit  Medication Sig Dispense Refill   ipratropium-albuterol (DUONEB) 0.5-2.5 (3) MG/3ML SOLN Inhale 0.5 mL as needed by nebulization route for 1 day.     albuterol (VENTOLIN HFA) 108 (90 Base)  MCG/ACT inhaler Inhale 3 puffs into the lungs every 4 (four) hours as needed for wheezing or shortness of breath. 6.7 each 0   ALPRAZolam (XANAX) 1 MG tablet TAKE 1/2 TABLET BY MOUTH 3 TIMES A DAY 45 tablet 2   atorvastatin (LIPITOR) 40 MG tablet TAKE 1 TABLET BY MOUTH EVERY DAY 90 tablet 0   Calcium Carbonate-Vitamin D (CALCIUM + D PO) Take 1 tablet by mouth every other day.     ustekinumab (STELARA) 45 MG/0.5ML SOSY injection Inject 45 mg into  the skin.     No current facility-administered medications on file prior to visit.    BP 128/88   Pulse (!) 59   Temp 98 F (36.7 C) (Oral)   Ht '5\' 4"'$  (1.626 m)   Wt 95 lb (43.1 kg)   SpO2 97%   BMI 16.31 kg/m       Objective:   Physical Exam Vitals and nursing note reviewed.  Constitutional:      General: She is not in acute distress.    Appearance: Normal appearance. She is underweight. She is not ill-appearing.  HENT:     Head: Normocephalic and atraumatic.     Right Ear: Tympanic membrane, ear canal and external ear normal. There is no impacted cerumen.     Left Ear: Tympanic membrane, ear canal and external ear normal. There is no impacted cerumen.     Nose: Nose normal. No congestion or rhinorrhea.     Mouth/Throat:     Mouth: Mucous membranes are moist.     Pharynx: Oropharynx is clear. No oropharyngeal exudate or posterior oropharyngeal erythema.  Eyes:     General:        Right eye: No discharge.        Left eye: No discharge.     Extraocular Movements: Extraocular movements intact.     Conjunctiva/sclera: Conjunctivae normal.     Pupils: Pupils are equal, round, and reactive to light.  Neck:     Thyroid: No thyromegaly.     Vascular: No carotid bruit.     Trachea: No tracheal deviation.  Cardiovascular:     Rate and Rhythm: Normal rate and regular rhythm.     Pulses: Normal pulses.     Heart sounds: Normal heart sounds. No murmur heard.    No friction rub. No gallop.  Pulmonary:     Effort: Pulmonary effort is  normal. No respiratory distress.     Breath sounds: Normal breath sounds. No stridor. No wheezing, rhonchi or rales.  Chest:     Chest wall: No tenderness.  Abdominal:     General: Abdomen is flat. Bowel sounds are normal. There is no distension.     Palpations: Abdomen is soft. There is no mass.     Tenderness: There is no abdominal tenderness. There is no right CVA tenderness, left CVA tenderness, guarding or rebound.     Hernia: No hernia is present.  Musculoskeletal:        General: No swelling, tenderness, deformity or signs of injury. Normal range of motion.     Cervical back: Normal range of motion and neck supple.     Right lower leg: No edema.     Left lower leg: No edema.  Lymphadenopathy:     Cervical: No cervical adenopathy.  Skin:    General: Skin is warm and dry.     Coloration: Skin is not jaundiced or pale.     Findings: No bruising, erythema, lesion or rash.  Neurological:     General: No focal deficit present.     Mental Status: She is alert and oriented to person, place, and time.     Cranial Nerves: No cranial nerve deficit.     Sensory: No sensory deficit.     Motor: No weakness.     Coordination: Coordination normal.     Gait: Gait normal.     Deep Tendon Reflexes: Reflexes normal.  Psychiatric:        Mood and Affect: Mood normal.  Behavior: Behavior normal.        Thought Content: Thought content normal.        Judgment: Judgment normal.        Assessment & Plan:  1. Routine general medical examination at a health care facility Today patient counseled on age appropriate routine health concerns for screening and prevention, each reviewed and up to date or declined. Immunizations reviewed and up to date or declined. Labs ordered and reviewed. Risk factors for depression reviewed and negative. Hearing function and visual acuity are intact. ADLs screened and addressed as needed. Functional ability and level of safety reviewed and appropriate.  Education, counseling and referrals performed based on assessed risks today. Patient provided with a copy of personalized plan for preventive services. - She needs to quit smoking  - Follow up in one year for CPE - Encouraged to call and schedule her colonoscopy  - Advised of routine vaccinations to get at pharmacy   2. Adjustment disorder with mixed anxiety and depressed mood - Continue with Xanasx 0.5 mg BID as needed - CBC with Differential/Platelet; Future - Comprehensive metabolic panel; Future - Lipid panel; Future - TSH; Future - TSH - Lipid panel - Comprehensive metabolic panel - CBC with Differential/Platelet  3. Coronary artery calcification - Continue statin  - CBC with Differential/Platelet; Future - Comprehensive metabolic panel; Future - Lipid panel; Future - TSH; Future - TSH - Lipid panel - Comprehensive metabolic panel - CBC with Differential/Platelet  4. Tobacco use - Encouraged to quit. She does not want to go on medication  - CBC with Differential/Platelet; Future - Comprehensive metabolic panel; Future - Lipid panel; Future - TSH; Future - TSH - Lipid panel - Comprehensive metabolic panel - CBC with Differential/Platelet  5. Psoriasis - Per dermatology   6. Need for hepatitis C screening test  - Hepatitis C antibody; Future - Hepatitis C antibody  7. Encounter for screening for HIV  - HIV Antibody (routine testing w rflx); Future - HIV Antibody (routine testing w rflx)  8. Unintentional weight loss - Will place her on Remeron 7.5 mg nightly as a appetite stimulant.  Follow-up in 3 months to see how she is doing - CBC with Differential/Platelet; Future - Comprehensive metabolic panel; Future - Lipid panel; Future - TSH; Future - HIV Antibody (routine testing w rflx); Future - mirtazapine (REMERON) 7.5 MG tablet; Take 1 tablet (7.5 mg total) by mouth at bedtime.  Dispense: 90 tablet; Refill: 0 - Hepatitis C antibody  9. Pulmonary  emphysema, unspecified emphysema type ([place]) - Use inhaler PRN  - Quit smoking   Dorothyann Peng, NP

## 2022-01-30 LAB — CBC WITH DIFFERENTIAL/PLATELET
Basophils Absolute: 0.1 10*3/uL (ref 0.0–0.1)
Basophils Relative: 1 % (ref 0.0–3.0)
Eosinophils Absolute: 0.1 10*3/uL (ref 0.0–0.7)
Eosinophils Relative: 1.8 % (ref 0.0–5.0)
HCT: 37.2 % (ref 36.0–46.0)
Hemoglobin: 12.6 g/dL (ref 12.0–15.0)
Lymphocytes Relative: 39.2 % (ref 12.0–46.0)
Lymphs Abs: 2.9 10*3/uL (ref 0.7–4.0)
MCHC: 33.9 g/dL (ref 30.0–36.0)
MCV: 90.1 fl (ref 78.0–100.0)
Monocytes Absolute: 0.6 10*3/uL (ref 0.1–1.0)
Monocytes Relative: 8.4 % (ref 3.0–12.0)
Neutro Abs: 3.7 10*3/uL (ref 1.4–7.7)
Neutrophils Relative %: 49.6 % (ref 43.0–77.0)
Platelets: 238 10*3/uL (ref 150.0–400.0)
RBC: 4.13 Mil/uL (ref 3.87–5.11)
RDW: 14 % (ref 11.5–15.5)
WBC: 7.5 10*3/uL (ref 4.0–10.5)

## 2022-01-30 LAB — LIPID PANEL
Cholesterol: 123 mg/dL (ref 0–200)
HDL: 61.9 mg/dL (ref >39.00)
LDL Cholesterol: 43 mg/dL (ref 0–99)
NonHDL: 60.81
Total CHOL/HDL Ratio: 2
Triglycerides: 91 mg/dL (ref 0.0–149.0)
VLDL: 18.2 mg/dL (ref 0.0–40.0)

## 2022-01-30 LAB — COMPREHENSIVE METABOLIC PANEL
ALT: 19 U/L (ref 0–35)
AST: 34 U/L (ref 0–37)
Albumin: 4.3 g/dL (ref 3.5–5.2)
Alkaline Phosphatase: 89 U/L (ref 39–117)
BUN: 14 mg/dL (ref 6–23)
CO2: 28 mEq/L (ref 19–32)
Calcium: 9.2 mg/dL (ref 8.4–10.5)
Chloride: 100 mEq/L (ref 96–112)
Creatinine, Ser: 0.67 mg/dL (ref 0.40–1.20)
GFR: 92.89 mL/min (ref >60.00)
Glucose, Bld: 73 mg/dL (ref 70–99)
Potassium: 4.1 mEq/L (ref 3.5–5.1)
Sodium: 136 mEq/L (ref 135–145)
Total Bilirubin: 0.5 mg/dL (ref 0.2–1.2)
Total Protein: 6.9 g/dL (ref 6.0–8.3)

## 2022-01-30 LAB — HIV ANTIBODY (ROUTINE TESTING W REFLEX): HIV 1&2 Ab, 4th Generation: NONREACTIVE

## 2022-01-30 LAB — HEPATITIS C ANTIBODY: Hepatitis C Ab: NONREACTIVE

## 2022-01-30 LAB — TSH: TSH: 3.84 u[IU]/mL (ref 0.35–5.50)

## 2022-01-31 ENCOUNTER — Other Ambulatory Visit: Payer: Self-pay | Admitting: Adult Health

## 2022-01-31 DIAGNOSIS — J439 Emphysema, unspecified: Secondary | ICD-10-CM

## 2022-01-31 DIAGNOSIS — Z720 Tobacco use: Secondary | ICD-10-CM

## 2022-01-31 MED ORDER — ALBUTEROL SULFATE HFA 108 (90 BASE) MCG/ACT IN AERS: 3.0000 | INHALATION_SPRAY | RESPIRATORY_TRACT | 3 refills | Status: AC | PRN

## 2022-02-19 ENCOUNTER — Other Ambulatory Visit: Payer: Self-pay | Admitting: Adult Health

## 2022-02-19 NOTE — Telephone Encounter (Signed)
Okay for refill?  

## 2022-03-24 ENCOUNTER — Other Ambulatory Visit: Payer: Self-pay | Admitting: Adult Health

## 2022-04-16 ENCOUNTER — Other Ambulatory Visit: Payer: Self-pay | Admitting: Adult Health

## 2022-04-16 DIAGNOSIS — R634 Abnormal weight loss: Secondary | ICD-10-CM

## 2022-05-27 ENCOUNTER — Other Ambulatory Visit: Payer: Self-pay | Admitting: Adult Health

## 2022-05-27 DIAGNOSIS — R634 Abnormal weight loss: Secondary | ICD-10-CM

## 2022-05-28 ENCOUNTER — Other Ambulatory Visit: Payer: Self-pay | Admitting: Adult Health

## 2022-05-29 NOTE — Telephone Encounter (Signed)
Okay for refill?  

## 2022-06-20 ENCOUNTER — Encounter: Payer: Self-pay | Admitting: Family Medicine

## 2022-06-20 ENCOUNTER — Ambulatory Visit (INDEPENDENT_AMBULATORY_CARE_PROVIDER_SITE_OTHER): Payer: 59 | Admitting: Family Medicine

## 2022-06-20 VITALS — BP 96/64 | HR 94 | Temp 97.7°F | Resp 16 | Ht 64.0 in | Wt 88.0 lb

## 2022-06-20 DIAGNOSIS — J439 Emphysema, unspecified: Secondary | ICD-10-CM | POA: Diagnosis not present

## 2022-06-20 LAB — POCT INFLUENZA A/B
Influenza A, POC: NEGATIVE
Influenza B, POC: NEGATIVE

## 2022-06-20 MED ORDER — METHYLPREDNISOLONE ACETATE 80 MG/ML IJ SUSP
80.0000 mg | Freq: Once | INTRAMUSCULAR | Status: AC
  Administered 2022-06-20: 80 mg via INTRAMUSCULAR

## 2022-06-20 MED ORDER — PREDNISONE 10 MG PO TABS: ORAL_TABLET | ORAL | 0 refills | Status: DC

## 2022-06-20 MED ORDER — DOXYCYCLINE HYCLATE 100 MG PO TABS: 100.0000 mg | ORAL_TABLET | Freq: Two times a day (BID) | ORAL | 0 refills | Status: DC

## 2022-06-20 NOTE — Progress Notes (Signed)
   Subjective:    Patient ID: Susan Aguirre, female    DOB: [birth year], 62 y.o.   MRN: [id number]  HPI URI- sxs started Tuesday w/ hacking cough.  + decreased appetite.  No fever.  + body aches.  + chest congestion, head congestion.  Did have an episode of post-tussive emesis.  + loose bowels.  Has used Albuterol inhaler w/ some relief.  Not having any sinus pain.  'very little' wheezing.  No ear pain.  No large crowds recently.   Review of Systems For ROS see HPI     Objective:   Physical Exam Vitals reviewed.  Constitutional:      Appearance: She is ill-appearing (cachectic).  HENT:     Head: Normocephalic and atraumatic.     Right Ear: Tympanic membrane and ear canal normal.     Left Ear: Tympanic membrane and ear canal normal.     Nose: Congestion present. No rhinorrhea.  Eyes:     Extraocular Movements: Extraocular movements intact.     Conjunctiva/sclera: Conjunctivae normal.  Cardiovascular:     Rate and Rhythm: Normal rate and regular rhythm.  Pulmonary:     Breath sounds: Wheezing (faint wheezes throughout) present.     Comments: Nearly constant hacking cough Musculoskeletal:     Cervical back: Normal range of motion.  Lymphadenopathy:     Cervical: No cervical adenopathy.  Skin:    General: Skin is warm and dry.  Neurological:     General: No focal deficit present.     Mental Status: She is oriented to person, place, and time.  Psychiatric:        Mood and Affect: Mood normal.        Behavior: Behavior normal.        Thought Content: Thought content normal.           Assessment & Plan:   Acute emphysema exacerbation- new.  Reviewed pt's imaging given her lengthy smoking history and she does have centrilobular emphysema.  Given her near constant cough, her wheezing on exam, and difficulty catching her breath, will treat w/ both Doxycycline and prednisone.  She is asking for a steroid injxn to 'jump start' the response.  80mg  Depomedrol given.   Encouraged her to use her albuterol inhaler as needed.  Reviewed supportive care and red flags that should prompt return.  Pt expressed understanding and is in agreement w/ plan.

## 2022-06-20 NOTE — Patient Instructions (Signed)
Follow up as needed or as scheduled START the Prednisone TOMORROW as directed- 3 pills at the same time x3 days, then 2 pills at the same time x3 days, then 1 pill daily.  Take w/ food  TAKE the Doxycycline twice daily w/ food USE the Albuterol inhaler for cough/wheezing/shortness of breath Make sure you are drinking LOTS of fluids Robitussin or Delsym to help w/ cough Call with any questions or concerns Hang in there!

## 2022-06-20 NOTE — Progress Notes (Signed)
After obtaining consent, and per orders of Dr. Beverely Low, injection of Depo 80 mg given by Olga Coaster. Patient instructed to remain in clinic for 20 minutes afterwards, and to report any adverse reaction to me immediately.

## 2022-06-25 ENCOUNTER — Encounter: Payer: Self-pay | Admitting: Family Medicine

## 2022-06-25 ENCOUNTER — Ambulatory Visit (INDEPENDENT_AMBULATORY_CARE_PROVIDER_SITE_OTHER): Payer: 59 | Admitting: Family Medicine

## 2022-06-25 VITALS — BP 120/80 | HR 73 | Temp 97.9°F | Resp 16 | Ht 64.0 in | Wt 89.1 lb

## 2022-06-25 DIAGNOSIS — J441 Chronic obstructive pulmonary disease with (acute) exacerbation: Secondary | ICD-10-CM

## 2022-06-25 DIAGNOSIS — T887XXA Unspecified adverse effect of drug or medicament, initial encounter: Secondary | ICD-10-CM | POA: Diagnosis not present

## 2022-06-25 DIAGNOSIS — R636 Underweight: Secondary | ICD-10-CM | POA: Diagnosis not present

## 2022-06-25 NOTE — Progress Notes (Signed)
Chief Complaint  Patient presents with   Follow-up    Started last Tuesday, seen Friday and given abx & steroids along with steroid injection. Breathing has improved & cough has improved, but have effects from steroids.    HPI:  [name] is a 62 y.o. female here with past medical history significant for psoriasis, asthma, hyperlipidemia, tobacco use disorder, and anxiety today with her husband to follow on recent visit, her PCP is not available. She was evaluated on 06/20/2022 for acute respiratory symptoms, diagnosed with COPD exacerbation. She was treated with methylprednisolone acetate 80 mg IM x 1 and discharged home with doxycycline 100 mg twice daily and prednisone taper. She reports great improvement of respiratory symptoms, about 50%.  She is reporting side effects from prednisone, which include "shaking", insomnia, nervousness, lightheadedness, and fatigue.  She reports a history of allergies and recent exposure to dust while cleaning an attic, which she believes may have contributed to her respiratory symptoms.   She denies associated CP, palpitations, abdominal pain, nausea, or focal neurologic deficit.  She states that she has been using albuterol inhaler occasionally but has not needed it in the past couple of days. She denies any coughing in the past few days but still experiences some nasal congestion and clear rhinorrhea. Denies dyspnea and wheezing.  Her appetite has been poor, but she mentions attempting to consume more protein.  Her husband is concerned about weight loss, he is requesting a medication to improve appetite.  She states that she has been prescribed mirtazapine but has not yet started medication.  Review of Systems  Constitutional:  Positive for appetite change, chills and fatigue.  Gastrointestinal:  Negative for abdominal pain and vomiting.       Negative for changes in bowel habits.  Genitourinary:  Negative for decreased urine volume,  dysuria and hematuria.  Allergic/Immunologic: Positive for environmental allergies.  Neurological:  Negative for syncope and facial asymmetry.  Psychiatric/Behavioral:  Negative for confusion and hallucinations.   See other pertinent positives and negatives in HPI.  Current Outpatient Medications on File Prior to Visit  Medication Sig Dispense Refill   albuterol (VENTOLIN HFA) 108 (90 Base) MCG/ACT inhaler Inhale 3 puffs into the lungs every 4 (four) hours as needed for wheezing or shortness of breath. 6.7 each 3   ALPRAZolam (XANAX) 1 MG tablet TAKE 1/2 TABLET BY MOUTH 3 TIMES A DAY 45 tablet 2   atorvastatin (LIPITOR) 40 MG tablet TAKE 1 TABLET BY MOUTH EVERY DAY 90 tablet 0   Calcium Carbonate-Vitamin D (CALCIUM + D PO) Take 1 tablet by mouth every other day.     doxycycline (VIBRA-TABS) 100 MG tablet Take 1 tablet (100 mg total) by mouth 2 (two) times daily. 20 tablet 0   ipratropium-albuterol (DUONEB) 0.5-2.5 (3) MG/3ML SOLN Inhale 0.5 mL as needed by nebulization route for 1 day.     mirtazapine (REMERON) 7.5 MG tablet TAKE 1 TABLET BY MOUTH EVERYDAY AT BEDTIME 90 tablet 0   ustekinumab (STELARA) 45 MG/0.5ML SOSY injection Inject 45 mg into the skin.     No current facility-administered medications on file prior to visit.   Past Medical History:  Diagnosis Date   Allergy    Anemia    "borderline"   Anxiety    Arthritis    Asthma    "bronchial asthmatic, flares up seasonally"   Calcification of aorta (HCC)    Abdominal aorta   CIN I (cervical intraepithelial neoplasia I)    Depression  Diverticulosis    Dysplastic colon polyp    Endometriosis    Hemorrhoid    Nasal congestion 10/06/2017   Osteoporosis    Psoriasis    Seasonal allergies    Allergies  Allergen Reactions   Codeine Nausea Only   Morphine And Codeine Nausea Only    Social History   Socioeconomic History   Marital status: Married    Spouse name: Not on file   Number of children: Not on file    Years of education: Not on file   Highest education level: Not on file  Occupational History   Not on file  Tobacco Use   Smoking status: Every Day    Packs/day: .75    Types: Cigarettes   Smokeless tobacco: Never  Vaping Use   Vaping Use: Never used  Substance and Sexual Activity   Alcohol use: Yes    Alcohol/week: 5.0 standard drinks of alcohol    Types: 5 Cans of beer per week    Comment: "social"   Drug use: No   Sexual activity: Yes    Birth control/protection: Surgical  Other Topics Concern   Not on file  Social History Narrative   Not on file   Social Determinants of Health   Financial Resource Strain: Not on file  Food Insecurity: Not on file  Transportation Needs: Not on file  Physical Activity: Not on file  Stress: Not on file  Social Connections: Not on file   Vitals:   06/25/22 1042  BP: 120/80  Pulse: 73  Resp: 16  Temp: 97.9 F (36.6 C)  SpO2: 94%   Wt Readings from Last 3 Encounters:  06/25/22 89 lb 2 oz (40.4 kg)  06/20/22 88 lb (39.9 kg)  01/29/22 95 lb (43.1 kg)   Body mass index is 15.3 kg/m.  Physical Exam Vitals and nursing note reviewed.  Constitutional:      General: She is not in acute distress.    Appearance: She is well-developed and underweight. She is not ill-appearing.  HENT:     Head: Normocephalic and atraumatic.     Right Ear: Tympanic membrane, ear canal and external ear normal.     Left Ear: Tympanic membrane, ear canal and external ear normal.     Nose: Rhinorrhea present.     Mouth/Throat:     Mouth: Mucous membranes are moist.  Eyes:     Conjunctiva/sclera: Conjunctivae normal.  Cardiovascular:     Rate and Rhythm: Normal rate and regular rhythm.     Heart sounds: No murmur heard. Pulmonary:     Effort: Pulmonary effort is normal. No respiratory distress.     Breath sounds: No stridor. Decreased breath sounds (Mild) present. No wheezing, rhonchi or rales.  Abdominal:     Palpations: Abdomen is soft. There is  no mass.     Tenderness: There is no abdominal tenderness.  Lymphadenopathy:     Cervical: No cervical adenopathy.  Skin:    General: Skin is warm.     Findings: No erythema or rash.  Neurological:     General: No focal deficit present.     Mental Status: She is alert and oriented to person, place, and time.     Comments: Otherwise stable gait, not assisted.  Psychiatric:        Mood and Affect: Mood and affect normal.   ASSESSMENT AND PLAN:  Ms Jassidy was seen today for follow-up.  Diagnoses and all orders for this visit:  Side effect of  medication Some of the symptoms she reported could certainly be attributed to prednisone, she has taken medication for 5 days, recommend stopping it.  COPD with acute exacerbation (HCC) Reporting great improvement, more than 50%. She has not interested in adding a ICS/LABA inhaler. Continue albuterol 1 to 2 puff every 4-6 hours as needed. We decided to hold on imaging today. Clearly instructed about warning signs.  If any new symptom she was instructed to let me know.  Underweight She has lost some weight since 01/2022. She has been prescribed mirtazapine 7.5 mg, which she has not started.  Recommend starting taking medication as prescribed. We discussed possible complications of low BMI. Increase protein intake, eat small portions at a time throughout the day. Recommend following with PCP in 2 to 3 months.  Return in about 12 weeks (around 09/17/2022), or if symptoms worsen or fail to improve.  Phylicia Mcgaugh G. [place], MD  [facility]. [place] office.

## 2022-06-25 NOTE — Patient Instructions (Addendum)
A few things to remember from today's visit:  COPD with acute exacerbation (HCC)  Stop Prednisone. Complete antibiotic treatment. Continue Albuterol inh 2 puff 3-4 times daily as needed. Monitor for new symptoms. Increase protein intake.  Start taking Mirtazapine and arrange an appt with Cory in 2-3 months.  Do not use My Chart to request refills or for acute issues that need immediate attention. If you send a my chart message, it may take a few days to be addressed, specially if I am not in the office.  Please be sure medication list is accurate. If a new problem present, please set up appointment sooner than planned today.

## 2022-09-01 ENCOUNTER — Other Ambulatory Visit: Payer: Self-pay | Admitting: Adult Health

## 2022-09-02 NOTE — Telephone Encounter (Signed)
Okay for refill?  

## 2022-09-05 ENCOUNTER — Encounter (HOSPITAL_COMMUNITY): Payer: Self-pay

## 2022-09-05 ENCOUNTER — Ambulatory Visit (HOSPITAL_COMMUNITY)
Admission: EM | Admit: 2022-09-05 | Discharge: 2022-09-05 | Disposition: A | Discharge status: Home / Self Care | Payer: 59 | Attending: Internal Medicine | Admitting: Internal Medicine

## 2022-09-05 DIAGNOSIS — L03119 Cellulitis of unspecified part of limb: Secondary | ICD-10-CM

## 2022-09-05 DIAGNOSIS — L02519 Cutaneous abscess of unspecified hand: Secondary | ICD-10-CM

## 2022-09-05 MED ORDER — LIDOCAINE HCL (PF) 1 % IJ SOLN
INTRAMUSCULAR | Status: AC
  Filled 2022-09-05: qty 2

## 2022-09-05 MED ORDER — DOXYCYCLINE HYCLATE 100 MG PO CAPS
100.0000 mg | ORAL_CAPSULE | Freq: Two times a day (BID) | ORAL | 0 refills | Status: AC
Start: 2022-09-05 — End: 2022-09-12

## 2022-09-05 NOTE — Discharge Instructions (Signed)
We drained your abscess today in the clinic and left open to continue to drain.  Continue to use warm compresses to the wound to further encourage drainage from the wound.  You may do this in the shower as well with gentle compresses to the area to allow more infected material to drain.   Take antibiotic as prescribed to treat infection to the abscess.   Change your dressings twice daily as the wound heals.  Do not apply any ointments, lotions, or powders to the wound.  You may take over the counter pain medicines as needed for pain once the numbing wears off.  If you notice any worsening signs of infection such as redness, swelling, fever, or worsening drainage, please return to urgent care for reevaluation.

## 2022-09-05 NOTE — ED Triage Notes (Signed)
Pt presents to office for R-hand bump x 2 weeks. Pt reports pain and swelling.

## 2022-09-05 NOTE — ED Provider Notes (Signed)
[facility]    CSN: [id number] Arrival date & time: 09/05/22  1113      History   Chief Complaint Chief Complaint  Patient presents with  . Hand Pain    HPI Susan Aguirre is a 62 y.o. female.   Patient presents to urgent care for evaluation of possible abscess to the dorsal aspect of the right hand that initially started 2 weeks ago.  She has noticed the abscess has grown significantly in size and tenderness over the last 2 weeks but has not noted any drainage coming from the site.  An area of redness and swelling surrounding the site started a few days ago as well. Denies recent antibiotic use in the last 3 months. No history of immunosuppression. She has been washing her hands and keeping the site clean but otherwise has not used any OTC medications to help with symptoms PTA.     Past Medical History:  Diagnosis Date  . Allergy   . Anemia    "borderline"  . Anxiety   . Arthritis   . Asthma    "bronchial asthmatic, flares up seasonally"  . Calcification of aorta (HCC)    Abdominal aorta  . CIN I (cervical intraepithelial neoplasia I)   . Depression   . Diverticulosis   . Dysplastic colon polyp   . Endometriosis   . Hemorrhoid   . Nasal congestion 10/06/2017  . Osteoporosis   . Psoriasis   . Seasonal allergies     Patient Active Problem List   Diagnosis Date Noted  . Coronary artery calcification 11/04/2016  . Tobacco use 11/04/2016  . Adjustment disorder with mixed anxiety and depressed mood 02/21/2013  . Colonic polyp 02/21/2011  . CIN I (cervical intraepithelial neoplasia I)   . Hemorrhoid   . Endometriosis     Past Surgical History:  Procedure Laterality Date  . ABDOMINAL HYSTERECTOMY  2000   TAH,LSO (RSO IN 2002)  . APPENDECTOMY  1982   LYSIS OF BOWEL ADHESIONS  . COLON SURGERY     "small amount of colon taken at time of oophorectomy in 2002"  . COLONOSCOPY    . COLPOSCOPY    . GYNECOLOGIC CRYOSURGERY    . LIPOMA  EXCISION     removed from back and right foot"done at different times"  . LUMBAR DISC SURGERY  06/09/2019  . MASS EXCISION  12/24/2011   Procedure: EXCISION MASS;  Surgeon: Nadara Mustard, MD;  Location: [facility];  Service: Orthopedics;  Laterality: Right;  Right Foot Excision Soft Tissue Mass  . OOPHORECTOMY  2002   DX LAP W/RSO  . OOPHORECTOMY  2000   TAH W LSO  . PELVIC LAPAROSCOPY     DL  . POLYPECTOMY    . TONSILLECTOMY      OB History     Gravida  1   Para      Term      Preterm      AB  1   Living  0      SAB      IAB      Ectopic      Multiple      Live Births               Home Medications    Prior to Admission medications   Medication Sig Start Date End Date Taking? Authorizing Provider  albuterol (VENTOLIN HFA) 108 (90 Base) MCG/ACT inhaler Inhale 3 puffs into the lungs every  4 (four) hours as needed for wheezing or shortness of breath. 01/31/22  Yes Nafziger, Kandee Keen, NP  ALPRAZolam (XANAX) 1 MG tablet Take 0.5 tablets (0.5 mg total) by mouth 3 (three) times daily. 09/02/22 10/02/22 Yes Nafziger, Kandee Keen, NP  atorvastatin (LIPITOR) 40 MG tablet TAKE 1 TABLET BY MOUTH EVERY DAY 05/28/22  Yes Nafziger, Kandee Keen, NP  Calcium Carbonate-Vitamin D (CALCIUM + D PO) Take 1 tablet by mouth every other day.   Yes [provider]  doxycycline (VIBRAMYCIN) 100 MG capsule Take 1 capsule (100 mg total) by mouth 2 (two) times daily for 7 days. 09/05/22 09/12/22 Yes StanhopeDonavan Burnet, FNP  ipratropium-albuterol (DUONEB) 0.5-2.5 (3) MG/3ML SOLN Inhale 0.5 mL as needed by nebulization route for 1 day. 11/13/20  Yes [provider]  mirtazapine (REMERON) 7.5 MG tablet TAKE 1 TABLET BY MOUTH EVERYDAY AT BEDTIME 05/28/22  Yes Nafziger, Kandee Keen, NP  ustekinumab (STELARA) 45 MG/0.5ML SOSY injection Inject 45 mg into the skin.   Yes [provider]    Family History Family History  Problem Relation Age of Onset  . Diabetes Mother   . Hypertension Mother   .  Lymphoma Father   . Cancer Father   . Lymphoma Paternal Aunt   . Diabetes Maternal Grandmother   . Hypertension Maternal Grandmother   . Heart disease Maternal Grandmother   . Colon polyps Neg Hx   . Crohn's disease Neg Hx   . Esophageal cancer Neg Hx   . Rectal cancer Neg Hx   . Stomach cancer Neg Hx     Social History Social History   Tobacco Use  . Smoking status: Every Day    Current packs/day: 0.75    Types: Cigarettes  . Smokeless tobacco: Never  Vaping Use  . Vaping status: Never Used  Substance Use Topics  . Alcohol use: Yes    Alcohol/week: 5.0 standard drinks of alcohol    Types: 5 Cans of beer per week    Comment: "social"  . Drug use: No     Allergies   Codeine and Morphine and codeine   Review of Systems Review of Systems Per HPI  Physical Exam Triage Vital Signs ED Triage Vitals [09/05/22 1158]  Encounter Vitals Group     BP 139/79     Systolic BP Percentile      Diastolic BP Percentile      Pulse Rate 79     Resp 16     Temp 97.9 F (36.6 C)     Temp Source Oral     SpO2 98 %     Weight      Height      Head Circumference      Peak Flow      Pain Score      Pain Loc      Pain Education      Exclude from Growth Chart    No data found.  Updated Vital Signs BP 139/79 (BP Location: Left Arm)   Pulse 79   Temp 97.9 F (36.6 C) (Oral)   Resp 16   SpO2 98%   Visual Acuity Right Eye Distance:   Left Eye Distance:   Bilateral Distance:    Right Eye Near:   Left Eye Near:    Bilateral Near:     Physical Exam Vitals and nursing note reviewed.  Constitutional:      Appearance: She is not ill-appearing or toxic-appearing.  HENT:     Head: Normocephalic and atraumatic.  Right Ear: Hearing and external ear normal.     Left Ear: Hearing and external ear normal.     Nose: Nose normal.     Mouth/Throat:     Lips: Pink.  Eyes:     General: Lids are normal. Vision grossly intact. Gaze aligned appropriately.     Extraocular  Movements: Extraocular movements intact.     Conjunctiva/sclera: Conjunctivae normal.  Pulmonary:     Effort: Pulmonary effort is normal.  Musculoskeletal:       Hands:     Cervical back: Neck supple.     Comments: 5/5 grip strength right hand, sensation intact distally.  +2 right radial pulse.  Less than 2 cap refill distally.  Skin:    General: Skin is warm and dry.     Capillary Refill: Capillary refill takes less than 2 seconds.     Findings: No rash.  Neurological:     General: No focal deficit present.     Mental Status: She is alert and oriented to person, place, and time. Mental status is at baseline.     Cranial Nerves: No dysarthria or facial asymmetry.  Psychiatric:        Mood and Affect: Mood normal.        Speech: Speech normal.        Behavior: Behavior normal.        Thought Content: Thought content normal.        Judgment: Judgment normal.     UC Treatments / Results  Labs (all labs ordered are listed, but only abnormal results are displayed) Labs Reviewed - No data to display  EKG   Radiology No results found.  Procedures Procedures (including critical care time)  Medications Ordered in UC Medications - No data to display  Initial Impression / Assessment and Plan / UC Course  I have reviewed the triage vital signs and the nursing notes.  Pertinent labs & imaging results that were available during my care of the patient were reviewed by me and considered in my medical decision making (see chart for details).     *** Final Clinical Impressions(s) / UC Diagnoses   Final diagnoses:  Cellulitis and abscess of hand     Discharge Instructions      We drained your abscess today in the clinic and left open to continue to drain.  Continue to use warm compresses to the wound to further encourage drainage from the wound.  You may do this in the shower as well with gentle compresses to the area to allow more infected material to drain.   Take  antibiotic as prescribed to treat infection to the abscess.   Change your dressings twice daily as the wound heals.  Do not apply any ointments, lotions, or powders to the wound.  You may take over the counter pain medicines as needed for pain once the numbing wears off.  If you notice any worsening signs of infection such as redness, swelling, fever, or worsening drainage, please return to urgent care for reevaluation.      ED Prescriptions     Medication Sig Dispense Auth. Provider   doxycycline (VIBRAMYCIN) 100 MG capsule Take 1 capsule (100 mg total) by mouth 2 (two) times daily for 7 days. 14 capsule Carlisle Beers, FNP      PDMP not reviewed this encounter.

## 2022-09-12 ENCOUNTER — Other Ambulatory Visit: Payer: Self-pay | Admitting: Orthopedic Surgery

## 2022-09-12 ENCOUNTER — Encounter (HOSPITAL_BASED_OUTPATIENT_CLINIC_OR_DEPARTMENT_OTHER): Payer: Self-pay | Admitting: Orthopedic Surgery

## 2022-09-12 ENCOUNTER — Other Ambulatory Visit: Payer: Self-pay

## 2022-09-12 DIAGNOSIS — R2231 Localized swelling, mass and lump, right upper limb: Secondary | ICD-10-CM | POA: Diagnosis not present

## 2022-09-15 ENCOUNTER — Ambulatory Visit (HOSPITAL_BASED_OUTPATIENT_CLINIC_OR_DEPARTMENT_OTHER)
Admission: RE | Admit: 2022-09-15 | Discharge: 2022-09-15 | Disposition: A | Discharge status: Home / Self Care | Payer: 59 | Attending: Orthopedic Surgery | Admitting: Orthopedic Surgery

## 2022-09-15 ENCOUNTER — Ambulatory Visit (HOSPITAL_BASED_OUTPATIENT_CLINIC_OR_DEPARTMENT_OTHER): Payer: 59 | Admitting: Anesthesiology

## 2022-09-15 ENCOUNTER — Encounter (HOSPITAL_BASED_OUTPATIENT_CLINIC_OR_DEPARTMENT_OTHER)
Admission: RE | Disposition: A | Discharge status: Home / Self Care | Payer: Self-pay | Source: Home / Self Care | Attending: Orthopedic Surgery

## 2022-09-15 ENCOUNTER — Encounter (HOSPITAL_BASED_OUTPATIENT_CLINIC_OR_DEPARTMENT_OTHER): Payer: Self-pay | Admitting: Orthopedic Surgery

## 2022-09-15 ENCOUNTER — Other Ambulatory Visit: Payer: Self-pay

## 2022-09-15 DIAGNOSIS — C7641 Malignant neoplasm of right upper limb: Secondary | ICD-10-CM | POA: Insufficient documentation

## 2022-09-15 DIAGNOSIS — F419 Anxiety disorder, unspecified: Secondary | ICD-10-CM | POA: Insufficient documentation

## 2022-09-15 DIAGNOSIS — F32A Depression, unspecified: Secondary | ICD-10-CM | POA: Diagnosis not present

## 2022-09-15 DIAGNOSIS — L989 Disorder of the skin and subcutaneous tissue, unspecified: Secondary | ICD-10-CM | POA: Diagnosis not present

## 2022-09-15 DIAGNOSIS — F17210 Nicotine dependence, cigarettes, uncomplicated: Secondary | ICD-10-CM | POA: Diagnosis not present

## 2022-09-15 DIAGNOSIS — R2231 Localized swelling, mass and lump, right upper limb: Secondary | ICD-10-CM | POA: Diagnosis not present

## 2022-09-15 DIAGNOSIS — R2230 Localized swelling, mass and lump, unspecified upper limb: Secondary | ICD-10-CM | POA: Diagnosis not present

## 2022-09-15 HISTORY — DX: Other specified postprocedural states: Z98.890

## 2022-09-15 HISTORY — PX: MASS EXCISION: SHX2000

## 2022-09-15 SURGERY — EXCISION MASS
Anesthesia: Monitor Anesthesia Care | Site: Hand | Laterality: Right

## 2022-09-15 MED ORDER — DIAZEPAM 2 MG PO TABS: 2.0000 mg | ORAL_TABLET | Freq: Three times a day (TID) | ORAL | 0 refills | Status: DC | PRN

## 2022-09-15 MED ORDER — CEFAZOLIN SODIUM-DEXTROSE 2-4 GM/100ML-% IV SOLN: 2.0000 g | INTRAVENOUS | Status: DC

## 2022-09-15 MED ORDER — CEFAZOLIN SODIUM-DEXTROSE 2-4 GM/100ML-% IV SOLN
INTRAVENOUS | Status: AC
  Filled 2022-09-15: qty 100

## 2022-09-15 MED ORDER — ONDANSETRON HCL 4 MG/2ML IJ SOLN: 4.0000 mg | Freq: Once | INTRAMUSCULAR | Status: DC | PRN

## 2022-09-15 MED ORDER — BUPIVACAINE HCL (PF) 0.25 % IJ SOLN
INTRAMUSCULAR | Status: AC
  Filled 2022-09-15: qty 30

## 2022-09-15 MED ORDER — PROPOFOL 10 MG/ML IV BOLUS
INTRAVENOUS | Status: DC | PRN
  Administered 2022-09-15 (×2): 30 mg via INTRAVENOUS

## 2022-09-15 MED ORDER — ONDANSETRON HCL 4 MG/2ML IJ SOLN
INTRAMUSCULAR | Status: DC | PRN
  Administered 2022-09-15: 4 mg via INTRAVENOUS

## 2022-09-15 MED ORDER — LACTATED RINGERS IV SOLN: INTRAVENOUS | Status: DC

## 2022-09-15 MED ORDER — BUPIVACAINE HCL (PF) 0.25 % IJ SOLN
INTRAMUSCULAR | Status: DC | PRN
  Administered 2022-09-15: 9 mL

## 2022-09-15 MED ORDER — MIDAZOLAM HCL 5 MG/5ML IJ SOLN
INTRAMUSCULAR | Status: DC | PRN
  Administered 2022-09-15 (×2): 1 mg via INTRAVENOUS

## 2022-09-15 MED ORDER — OXYCODONE HCL 5 MG PO TABS: 5.0000 mg | ORAL_TABLET | Freq: Once | ORAL | Status: DC | PRN

## 2022-09-15 MED ORDER — PROPOFOL 10 MG/ML IV BOLUS
INTRAVENOUS | Status: AC
  Filled 2022-09-15: qty 20

## 2022-09-15 MED ORDER — 0.9 % SODIUM CHLORIDE (POUR BTL) OPTIME
TOPICAL | Status: DC | PRN
Start: 2022-09-15 — End: 2022-09-15
  Administered 2022-09-15: 120 mL

## 2022-09-15 MED ORDER — MEPERIDINE HCL 25 MG/ML IJ SOLN: 6.2500 mg | INTRAMUSCULAR | Status: DC | PRN

## 2022-09-15 MED ORDER — MIDAZOLAM HCL 2 MG/2ML IJ SOLN
INTRAMUSCULAR | Status: AC
  Filled 2022-09-15: qty 2

## 2022-09-15 MED ORDER — LIDOCAINE HCL (PF) 0.5 % IJ SOLN
INTRAMUSCULAR | Status: DC | PRN
  Administered 2022-09-15: 30 mL via INTRAVENOUS

## 2022-09-15 MED ORDER — FENTANYL CITRATE (PF) 100 MCG/2ML IJ SOLN
INTRAMUSCULAR | Status: DC | PRN
  Administered 2022-09-15: 50 ug via INTRAVENOUS

## 2022-09-15 MED ORDER — OXYCODONE HCL 5 MG/5ML PO SOLN: 5.0000 mg | Freq: Once | ORAL | Status: DC | PRN

## 2022-09-15 MED ORDER — FENTANYL CITRATE (PF) 100 MCG/2ML IJ SOLN: 25.0000 ug | INTRAMUSCULAR | Status: DC | PRN

## 2022-09-15 MED ORDER — ACETAMINOPHEN 160 MG/5ML PO SOLN: 325.0000 mg | ORAL | Status: DC | PRN

## 2022-09-15 MED ORDER — ACETAMINOPHEN 325 MG PO TABS: 325.0000 mg | ORAL_TABLET | ORAL | Status: DC | PRN

## 2022-09-15 MED ORDER — FENTANYL CITRATE (PF) 100 MCG/2ML IJ SOLN
INTRAMUSCULAR | Status: AC
  Filled 2022-09-15: qty 2

## 2022-09-15 MED ORDER — PROPOFOL 500 MG/50ML IV EMUL
INTRAVENOUS | Status: DC | PRN
  Administered 2022-09-15: 75 ug/kg/min via INTRAVENOUS

## 2022-09-15 SURGICAL SUPPLY — 60 items
APL PRP STRL LF DISP 70% ISPRP (MISCELLANEOUS) ×1
APL SKNCLS STERI-STRIP NONHPOA (GAUZE/BANDAGES/DRESSINGS)
BANDAGE GAUZE 1X75IN STRL (MISCELLANEOUS) IMPLANT
BENZOIN TINCTURE PRP APPL 2/3 (GAUZE/BANDAGES/DRESSINGS) IMPLANT
BLADE MINI RND TIP GREEN BEAV (BLADE) IMPLANT
BLADE SURG 15 STRL LF DISP TIS (BLADE) ×4 IMPLANT
BLADE SURG 15 STRL SS (BLADE) ×2
BNDG CMPR 5X2 CHSV 1 LYR STRL (GAUZE/BANDAGES/DRESSINGS) ×1
BNDG CMPR 5X2 KNTD ELC UNQ LF (GAUZE/BANDAGES/DRESSINGS)
BNDG CMPR 5X3 KNIT ELC UNQ LF (GAUZE/BANDAGES/DRESSINGS)
BNDG CMPR 75X11 PLY HI ABS (MISCELLANEOUS)
BNDG CMPR 75X21 PLY HI ABS (MISCELLANEOUS)
BNDG CMPR 9X4 STRL LF SNTH (GAUZE/BANDAGES/DRESSINGS) ×1
BNDG COHESIVE 1X5 TAN STRL LF (GAUZE/BANDAGES/DRESSINGS) IMPLANT
BNDG COHESIVE 2X5 TAN ST LF (GAUZE/BANDAGES/DRESSINGS) IMPLANT
BNDG ELASTIC 2INX 5YD STR LF (GAUZE/BANDAGES/DRESSINGS) IMPLANT
BNDG ELASTIC 3INX 5YD STR LF (GAUZE/BANDAGES/DRESSINGS) IMPLANT
BNDG ESMARK 4X9 LF (GAUZE/BANDAGES/DRESSINGS) IMPLANT
BNDG GAUZE 1X75IN STRL (MISCELLANEOUS)
BNDG GAUZE DERMACEA FLUFF 4 (GAUZE/BANDAGES/DRESSINGS) IMPLANT
BNDG GZE DERMACEA 4 6PLY (GAUZE/BANDAGES/DRESSINGS) ×1
BNDG PLASTER X FAST 3X3 WHT LF (CAST SUPPLIES) IMPLANT
BNDG PLSTR 9X3 FST ST WHT (CAST SUPPLIES)
CHLORAPREP W/TINT 26 (MISCELLANEOUS) ×2 IMPLANT
CORD BIPOLAR FORCEPS 12FT (ELECTRODE) ×2 IMPLANT
COVER BACK TABLE 60X90IN (DRAPES) ×2 IMPLANT
COVER MAYO STAND STRL (DRAPES) ×2 IMPLANT
CUFF TOURN SGL QUICK 12 (TOURNIQUET CUFF) IMPLANT
CUFF TOURN SGL QUICK 18X4 (TOURNIQUET CUFF) ×2 IMPLANT
DRAPE EXTREMITY T 121X128X90 (DISPOSABLE) ×2 IMPLANT
DRAPE SURG 17X23 STRL (DRAPES) ×2 IMPLANT
GAUZE SPONGE 4X4 12PLY STRL (GAUZE/BANDAGES/DRESSINGS) ×2 IMPLANT
GAUZE STRETCH 2X75IN STRL (MISCELLANEOUS) IMPLANT
GAUZE XEROFORM 1X8 LF (GAUZE/BANDAGES/DRESSINGS) ×2 IMPLANT
GLOVE BIO SURGEON STRL SZ7 (GLOVE) IMPLANT
GLOVE BIO SURGEON STRL SZ7.5 (GLOVE) ×2 IMPLANT
GLOVE BIOGEL PI IND STRL 7.0 (GLOVE) IMPLANT
GLOVE BIOGEL PI IND STRL 8 (GLOVE) ×2 IMPLANT
GOWN STRL REUS W/ TWL LRG LVL3 (GOWN DISPOSABLE) ×2 IMPLANT
GOWN STRL REUS W/TWL LRG LVL3 (GOWN DISPOSABLE) ×1
GOWN STRL REUS W/TWL XL LVL3 (GOWN DISPOSABLE) ×2 IMPLANT
NDL HYPO 25X1 1.5 SAFETY (NEEDLE) ×2 IMPLANT
NEEDLE HYPO 25X1 1.5 SAFETY (NEEDLE) ×1
NS IRRIG 1000ML POUR BTL (IV SOLUTION) ×2 IMPLANT
PACK BASIN DAY SURGERY FS (CUSTOM PROCEDURE TRAY) ×2 IMPLANT
PAD CAST 3X4 CTTN HI CHSV (CAST SUPPLIES) IMPLANT
PAD CAST 4YDX4 CTTN HI CHSV (CAST SUPPLIES) IMPLANT
PADDING CAST ABS COTTON 4X4 ST (CAST SUPPLIES) ×2 IMPLANT
PADDING CAST COTTON 3X4 STRL (CAST SUPPLIES)
PADDING CAST COTTON 4X4 STRL (CAST SUPPLIES)
STOCKINETTE 4X48 STRL (DRAPES) ×2 IMPLANT
STRIP CLOSURE SKIN 1/2X4 (GAUZE/BANDAGES/DRESSINGS) IMPLANT
SUT ETHILON 3 0 PS 1 (SUTURE) IMPLANT
SUT ETHILON 4 0 PS 2 18 (SUTURE) ×2 IMPLANT
SUT NYLON ETHILON 5-0 P-3 1X18 (SUTURE) IMPLANT
SUT VIC AB 4-0 P2 18 (SUTURE) IMPLANT
SYR BULB EAR ULCER 3OZ GRN STR (SYRINGE) ×2 IMPLANT
SYR CONTROL 10ML LL (SYRINGE) ×2 IMPLANT
TOWEL GREEN STERILE FF (TOWEL DISPOSABLE) ×4 IMPLANT
UNDERPAD 30X36 HEAVY ABSORB (UNDERPADS AND DIAPERS) ×2 IMPLANT

## 2022-09-15 NOTE — Anesthesia Preprocedure Evaluation (Addendum)
Anesthesia Evaluation  Patient identified by MRN, date of birth, ID band Patient awake    Reviewed: Allergy & Precautions, H&P , NPO status , Patient's Chart, lab work & pertinent test results, reviewed documented beta blocker date and time   History of Anesthesia Complications (+) PONV and history of anesthetic complications  Airway Mallampati: II  TM Distance: >3 FB Neck ROM: full    Dental no notable dental hx. (+) Teeth Intact, Caps, Dental Advisory Given   Pulmonary Current Smoker   Pulmonary exam normal breath sounds clear to auscultation       Cardiovascular Exercise Tolerance: Good negative cardio ROS  Rhythm:regular Rate:Normal     Neuro/Psych  PSYCHIATRIC DISORDERS Anxiety Depression    negative neurological ROS     GI/Hepatic negative GI ROS, Neg liver ROS,,,  Endo/Other  negative endocrine ROS    Renal/GU negative Renal ROS  negative genitourinary   Musculoskeletal  (+) Arthritis ,    Abdominal   Peds  Hematology negative hematology ROS (+)   Anesthesia Other Findings   Reproductive/Obstetrics negative OB ROS                             Anesthesia Physical Anesthesia Plan  ASA: 2  Anesthesia Plan: MAC and Bier Block and Bier Block-Lidocaine Only   Post-op Pain Management: Minimal or no pain anticipated   Induction: Intravenous  PONV Risk Score and Plan: 3 and Ondansetron, Dexamethasone and Treatment may vary due to age or medical condition  Airway Management Planned: Natural Airway, Simple Face Mask and Mask  Additional Equipment: None  Intra-op Plan:   Post-operative Plan: Extubation in OR  Informed Consent: I have reviewed the patients History and Physical, chart, labs and discussed the procedure including the risks, benefits and alternatives for the proposed anesthesia with the patient or authorized representative who has indicated his/her understanding and  acceptance.     Dental Advisory Given  Plan Discussed with: CRNA, Surgeon and Anesthesiologist  Anesthesia Plan Comments: ( )        Anesthesia Quick Evaluation

## 2022-09-15 NOTE — Op Note (Signed)
NAME: Katlyn Landuyt Rideout MEDICAL RECORD NO: [id number] DATE OF BIRTH: [birth year] FACILITY: [facility] LOCATION: [facility] PHYSICIAN: Tami Ribas, MD   OPERATIVE REPORT   DATE OF PROCEDURE: 09/15/22    PREOPERATIVE DIAGNOSIS: Right hand skin lesion   POSTOPERATIVE DIAGNOSIS: Right hand skin lesion approximately 1 cm   PROCEDURE: Excision of right hand skin lesion approximately 1 cm, excised diameter approximately 2 cm   SURGEON:  Betha Loa, M.D.   ASSISTANT: none   ANESTHESIA:  Bier block with sedation   INTRAVENOUS FLUIDS:  Per anesthesia flow sheet.   ESTIMATED BLOOD LOSS:  Minimal.   COMPLICATIONS:  None.   SPECIMENS: Right hand skin lesion to pathology   TOURNIQUET TIME:    Total Tourniquet Time Documented: Forearm (Right) - 20 minutes Total: Forearm (Right) - 20 minutes    DISPOSITION:  Stable to PACU.   INDICATIONS: 62 year old female is noted a lesion on the dorsum of her right hand at the thumb index webspace over the past few weeks.  She wishes to have this removed and sent to pathology for examination.  Risks, benefits and alternatives of surgery were discussed including the risks of blood loss, infection, damage to nerves, vessels, tendons, ligaments, bone for surgery, need for additional surgery, complications with wound healing, continued pain, stiffness, , recurrence.  She voiced understanding of these risks and elected to proceed.  OPERATIVE COURSE:  After being identified preoperatively by myself,  the patient and I agreed on the procedure and site of the procedure.  The surgical site was marked.  Surgical consent had been signed. Preoperative IV antibiotic prophylaxis was given. She was transferred to the operating room and placed on the operating table in supine position with the Right upper extremity on an arm board.  Bier block anesthesia was induced by the anesthesiologist.  Right upper extremity was prepped and draped in normal  sterile orthopedic fashion.  A surgical pause was performed between the surgeons, anesthesia, and operating room staff and all were in agreement as to the patient, procedure, and site of procedure.  Tourniquet at the proximal aspect of the forearm had been inflated for the Bier block.  The lesion on the dorsum of the right hand was ellipsed out.  This was done just adjacent to the lesion itself.  The lesion was masslike and had a central scab.  The lesion measured approximately 1 cm in diameter.  The total excised length was approximately 2 cm in width just over 1 cm.  The subcutaneous tissues were entered carefully by spreading technique.  Bipolar cautery was used to obtain hemostasis.  There was vascularity going into the lesion.  The lesion did not go through the subcutaneous tissues and the fascia was intact over the muscle.  The lesion was removed and tagged distally with a suture.  It was sent to pathology for examination.  The wound was copiously irrigated with sterile saline and closed with 4-0 nylon in a horizontal mattress fashion.  The area was injected with quarter percent plain Marcaine to aid in postoperative analgesia.  The wound was dressed with sterile Xeroform 4 x 4's and wrapped with a Coban dressing lightly.  The tourniquet was deflated at 20 minutes.  Fingertips were pink with brisk capillary refill after deflation of tourniquet.  The operative  drapes were broken down.  The patient was awoken from anesthesia safely.  She was transferred back to the stretcher and taken to PACU in stable condition.  I will see  her back in the office in 1 week for postoperative followup.  I will give her a prescription for Valium at her request for pain control as she does not do well with pain medications.   Betha Loa, MD Electronically signed, 09/15/22

## 2022-09-15 NOTE — Discharge Instructions (Addendum)
[facility] Instructions °Hand Surgery ° °Wound Care: °Keep your hand elevated above the level of your heart.  Do not allow it to dangle by your side.  Keep the dressing dry and do not remove it unless your doctor advises you to do so.  He will usually change it at the time of your post-op visit.  Moving your fingers is advised to stimulate circulation but will depend on the site of your surgery.  If you have a splint applied, your doctor will advise you regarding movement. ° °Activity: °Do not drive or operate machinery today.  Rest today and then you may return to your normal activity and work as indicated by your physician. ° °Diet:  °Drink liquids today or eat a light diet.  You may resume a regular diet tomorrow.   ° °General expectations: °Pain for two to three days. °Fingers may become slightly swollen. ° °Call your doctor if any of the following occur: °Severe pain not relieved by pain medication. °Elevated temperature. °Dressing soaked with blood. °Inability to move fingers. °White or bluish color to fingers. ° ° °Post Anesthesia Home Care Instructions ° °Activity: °Get plenty of rest for the remainder of the day. A responsible individual must stay with you for 24 hours following the procedure.  °For the next 24 hours, DO NOT: °-Drive a car °-Operate machinery °-Drink alcoholic beverages °-Take any medication unless instructed by your physician °-Make any legal decisions or sign important papers. ° °Meals: °Start with liquid foods such as gelatin or soup. Progress to regular foods as tolerated. Avoid greasy, spicy, heavy foods. If nausea and/or vomiting occur, drink only clear liquids until the nausea and/or vomiting subsides. Call your physician if vomiting continues. ° °Special Instructions/Symptoms: °Your throat may feel dry or sore from the anesthesia or the breathing tube placed in your throat during surgery. If this causes discomfort, gargle with warm salt water. The discomfort should disappear within  24 hours. ° °If you had a scopolamine patch placed behind your ear for the management of post- operative nausea and/or vomiting: ° °1. The medication in the patch is effective for 72 hours, after which it should be removed.  Wrap patch in a tissue and discard in the trash. Wash hands thoroughly with soap and water. °2. You may remove the patch earlier than 72 hours if you experience unpleasant side effects which may include dry mouth, dizziness or visual disturbances. °3. Avoid touching the patch. Wash your hands with soap and water after contact with the patch. °  ° ° °

## 2022-09-15 NOTE — H&P (Signed)
Susan Aguirre is an 62 y.o. female.   Chief Complaint: hand mass HPI: 62 yo female with skin lesion on dorsum right hand.  This has been lanced by urgent care previously.  She wishes to have the lesion removed.  Allergies:  Allergies  Allergen Reactions   Codeine Nausea Only   Morphine And Codeine Nausea Only    Past Medical History:  Diagnosis Date   Allergy    Anemia    "borderline"   Anxiety    Arthritis    Asthma    "bronchial asthmatic, flares up seasonally"   Calcification of aorta (HCC)    Abdominal aorta   CIN I (cervical intraepithelial neoplasia I)    Depression    Diverticulosis    Dysplastic colon polyp    Endometriosis    Hemorrhoid    Nasal congestion 10/06/2017   Osteoporosis    PONV (postoperative nausea and vomiting)    Psoriasis    Seasonal allergies     Past Surgical History:  Procedure Laterality Date   ABDOMINAL HYSTERECTOMY  2000   TAH,LSO (RSO IN 2002)   APPENDECTOMY  1982   LYSIS OF BOWEL ADHESIONS   COLON SURGERY     "small amount of colon taken at time of oophorectomy in 2002"   COLONOSCOPY     COLPOSCOPY     GYNECOLOGIC CRYOSURGERY     LIPOMA EXCISION     removed from back and right foot"done at different times"   LUMBAR DISC SURGERY  06/09/2019   MASS EXCISION  12/24/2011   Procedure: EXCISION MASS;  Surgeon: Nadara Mustard, MD;  Location: [facility] OR;  Service: Orthopedics;  Laterality: Right;  Right Foot Excision Soft Tissue Mass   OOPHORECTOMY  2002   DX LAP W/RSO   OOPHORECTOMY  2000   TAH W LSO   PELVIC LAPAROSCOPY     DL   POLYPECTOMY     TONSILLECTOMY      Family History: Family History  Problem Relation Age of Onset   Diabetes Mother    Hypertension Mother    Lymphoma Father    Cancer Father    Lymphoma Paternal Aunt    Diabetes Maternal Grandmother    Hypertension Maternal Grandmother    Heart disease Maternal Grandmother    Colon polyps Neg Hx    Crohn's disease Neg Hx    Esophageal cancer Neg Hx     Rectal cancer Neg Hx    Stomach cancer Neg Hx     Social History:   reports that she has been smoking cigarettes. She has never used smokeless tobacco. She reports current alcohol use of about 5.0 standard drinks of alcohol per week. She reports that she does not use drugs.  Medications: Medications Prior to Admission  Medication Sig Dispense Refill   ALPRAZolam (XANAX) 1 MG tablet Take 0.5 tablets (0.5 mg total) by mouth 3 (three) times daily. 45 tablet 2   atorvastatin (LIPITOR) 40 MG tablet TAKE 1 TABLET BY MOUTH EVERY DAY 90 tablet 0   albuterol (VENTOLIN HFA) 108 (90 Base) MCG/ACT inhaler Inhale 3 puffs into the lungs every 4 (four) hours as needed for wheezing or shortness of breath. 6.7 each 3   ustekinumab (STELARA) 45 MG/0.5ML SOSY injection Inject 45 mg into the skin.      No results found for this or any previous visit (from the past 48 hour(s)).  No results found.    Height 5\' 6"  (1.676 m), weight 45.4 kg.  General  appearance: alert, cooperative, and appears stated age Head: Normocephalic, without obvious abnormality, atraumatic Neck: supple, symmetrical, trachea midline Extremities: Intact sensation and capillary refill all digits.  +epl/fpl/io.  No wounds. Skin lesion on dorsum left hand at thumb index web space. Pulses: 2+ and symmetric Skin: Skin color, texture, turgor normal. No rashes or lesions Neurologic: Grossly normal Incision/Wound: scabbed over wound on lesion  Assessment/Plan Left hand skin lesion.  Non operative and operative treatment options have been discussed with the patient and patient wishes to proceed with operative treatment. Risks, benefits and alternatives of surgery were discussed including risks of blood loss, infection, damage to nerves/vessels/tendons/ligament/bone, failure of surgery, need for additional surgery, complication with wound healing, stiffness, recurrence, need for further excision.  She voiced understanding of these risks and  elected to proceed.    Betha Loa 09/15/2022, 1:13 PM

## 2022-09-15 NOTE — Transfer of Care (Signed)
Immediate Anesthesia Transfer of Care Note  Patient: Colletta Maryland Streat  Procedure(s) Performed: EXCISION MASS DORSUM RIGHT HAND (Right: Hand)  Patient Location: PACU  Anesthesia Type:MAC and Bier block  Level of Consciousness: awake, alert , and oriented  Airway & Oxygen Therapy: Patient Spontanous Breathing  Post-op Assessment: Report given to RN and Post -op Vital signs reviewed and stable  Post vital signs: Reviewed and stable  Last Vitals:  Vitals Value Taken Time  BP    Temp    Pulse 75 09/15/22 1512  Resp 21 09/15/22 1512  SpO2 100 % 09/15/22 1512  Vitals shown include unfiled device data.  Last Pain:  Vitals:   09/15/22 1329  PainSc: 2       Patients Stated Pain Goal: 5 (09/15/22 1329)  Complications: No notable events documented.

## 2022-09-15 NOTE — Anesthesia Postprocedure Evaluation (Signed)
Anesthesia Post Note  Patient: Susan Aguirre  Procedure(s) Performed: EXCISION MASS DORSUM RIGHT HAND (Right: Hand)     Patient location during evaluation: PACU Anesthesia Type: MAC Level of consciousness: awake and alert Pain management: pain level controlled Vital Signs Assessment: post-procedure vital signs reviewed and stable Respiratory status: spontaneous breathing, nonlabored ventilation, respiratory function stable and patient connected to nasal cannula oxygen Cardiovascular status: stable and blood pressure returned to baseline Postop Assessment: no apparent nausea or vomiting Anesthetic complications: no   No notable events documented.  Last Vitals:  Vitals:   09/15/22 1515 09/15/22 1530  BP: (!) 147/90 (!) 138/91  Pulse: 75 71  Resp: 14 11  Temp:    SpO2: 100% 95%    Last Pain:  Vitals:   09/15/22 1530  PainSc: 0-No pain                 Cali Cuartas

## 2022-09-15 NOTE — Anesthesia Procedure Notes (Signed)
Anesthesia Regional Block: Bier block (IV Regional)   Pre-Anesthetic Checklist: , timeout performed,  Correct Patient, Correct Site, Correct Laterality,  Correct Procedure,, site marked,  Surgical consent,  At surgeon's request  Laterality: Right and Upper         Needles:  Injection technique: Single-shot  Needle Type: Other      Needle Gauge: 22     Additional Needles:   Procedures:,,,,, intact distal pulses, Esmarch exsanguination,  Single tourniquet utilized    Narrative:  Start time: 09/15/2022 2:44 PM End time: 09/15/2022 2:44 PM Injection made incrementally with aspirations every 30 mL.  Performed by: Personally

## 2022-09-16 ENCOUNTER — Encounter (HOSPITAL_BASED_OUTPATIENT_CLINIC_OR_DEPARTMENT_OTHER): Payer: Self-pay | Admitting: Orthopedic Surgery

## 2022-09-17 LAB — SURGICAL PATHOLOGY

## 2022-09-22 DIAGNOSIS — C44622 Squamous cell carcinoma of skin of right upper limb, including shoulder: Secondary | ICD-10-CM | POA: Diagnosis not present

## 2022-09-22 DIAGNOSIS — R2231 Localized swelling, mass and lump, right upper limb: Secondary | ICD-10-CM | POA: Diagnosis not present

## 2022-09-29 ENCOUNTER — Other Ambulatory Visit: Payer: 59

## 2022-09-29 ENCOUNTER — Ambulatory Visit
Admission: RE | Admit: 2022-09-29 | Discharge: 2022-09-29 | Disposition: A | Discharge status: Home / Self Care | Payer: 59 | Source: Ambulatory Visit | Attending: Acute Care | Admitting: Acute Care

## 2022-09-29 DIAGNOSIS — F17210 Nicotine dependence, cigarettes, uncomplicated: Secondary | ICD-10-CM | POA: Diagnosis not present

## 2022-09-29 DIAGNOSIS — Z87891 Personal history of nicotine dependence: Secondary | ICD-10-CM

## 2022-09-29 DIAGNOSIS — Z122 Encounter for screening for malignant neoplasm of respiratory organs: Secondary | ICD-10-CM

## 2022-09-30 DIAGNOSIS — C44622 Squamous cell carcinoma of skin of right upper limb, including shoulder: Secondary | ICD-10-CM | POA: Diagnosis not present

## 2022-09-30 DIAGNOSIS — R2231 Localized swelling, mass and lump, right upper limb: Secondary | ICD-10-CM | POA: Diagnosis not present

## 2022-10-13 ENCOUNTER — Other Ambulatory Visit: Payer: Self-pay | Admitting: *Deleted

## 2022-10-13 DIAGNOSIS — F17210 Nicotine dependence, cigarettes, uncomplicated: Secondary | ICD-10-CM

## 2022-10-13 DIAGNOSIS — Z87891 Personal history of nicotine dependence: Secondary | ICD-10-CM

## 2022-10-13 DIAGNOSIS — Z122 Encounter for screening for malignant neoplasm of respiratory organs: Secondary | ICD-10-CM

## 2022-10-26 ENCOUNTER — Other Ambulatory Visit: Payer: Self-pay | Admitting: Adult Health

## 2022-10-27 DIAGNOSIS — C44622 Squamous cell carcinoma of skin of right upper limb, including shoulder: Secondary | ICD-10-CM | POA: Diagnosis not present

## 2022-12-01 DIAGNOSIS — C44622 Squamous cell carcinoma of skin of right upper limb, including shoulder: Secondary | ICD-10-CM | POA: Diagnosis not present

## 2022-12-05 ENCOUNTER — Other Ambulatory Visit: Payer: Self-pay | Admitting: Adult Health

## 2022-12-09 NOTE — Telephone Encounter (Signed)
Okay for refill?   CPE 01/2022

## 2023-01-31 ENCOUNTER — Other Ambulatory Visit: Payer: Self-pay | Admitting: Adult Health

## 2023-02-03 NOTE — Telephone Encounter (Signed)
Pt needs to schedule a CPE for further refills.

## 2023-02-04 ENCOUNTER — Other Ambulatory Visit: Payer: Self-pay | Admitting: Adult Health

## 2023-02-06 NOTE — Telephone Encounter (Signed)
Tried to call the pt to scheduled a CPE but no answer. Pt has not been seen in a year. No refills.

## 2023-02-10 ENCOUNTER — Other Ambulatory Visit: Payer: Self-pay

## 2023-02-10 ENCOUNTER — Other Ambulatory Visit: Payer: Self-pay | Admitting: Adult Health

## 2023-02-10 MED ORDER — ATORVASTATIN CALCIUM 40 MG PO TABS: 40.0000 mg | ORAL_TABLET | Freq: Every day | ORAL | 0 refills | Status: DC

## 2023-03-10 ENCOUNTER — Other Ambulatory Visit: Payer: Self-pay | Admitting: Adult Health

## 2023-03-18 ENCOUNTER — Ambulatory Visit (INDEPENDENT_AMBULATORY_CARE_PROVIDER_SITE_OTHER): Payer: 59 | Admitting: Adult Health

## 2023-03-18 VITALS — BP 120/80 | HR 54 | Temp 97.6°F | Wt 92.0 lb

## 2023-03-18 DIAGNOSIS — L409 Psoriasis, unspecified: Secondary | ICD-10-CM

## 2023-03-18 DIAGNOSIS — F4323 Adjustment disorder with mixed anxiety and depressed mood: Secondary | ICD-10-CM

## 2023-03-18 DIAGNOSIS — Z0000 Encounter for general adult medical examination without abnormal findings: Secondary | ICD-10-CM | POA: Diagnosis not present

## 2023-03-18 DIAGNOSIS — J439 Emphysema, unspecified: Secondary | ICD-10-CM

## 2023-03-18 DIAGNOSIS — I700 Atherosclerosis of aorta: Secondary | ICD-10-CM

## 2023-03-18 DIAGNOSIS — Z720 Tobacco use: Secondary | ICD-10-CM

## 2023-03-18 MED ORDER — ALPRAZOLAM 1 MG PO TABS
0.5000 mg | ORAL_TABLET | Freq: Three times a day (TID) | ORAL | 2 refills | Status: DC
Start: 2023-03-18 — End: 2023-06-18

## 2023-03-18 NOTE — Patient Instructions (Signed)
 It was great seeing you today   We will follow up with you regarding your lab work   Please let me know if you need anything

## 2023-03-18 NOTE — Progress Notes (Addendum)
 Subjective:    Patient ID: Susan Aguirre, female    DOB: 1960-03-04, 63 y.o.   MRN: [id number]  HPI Patient presents for yearly preventative medicine examination. She is a pleasant 63 year old female who  has a past medical history of Allergy, Anemia, Anxiety, Arthritis, Asthma, Calcification of aorta (HCC), CIN I (cervical intraepithelial neoplasia I), Depression, Diverticulosis, Dysplastic colon polyp, Endometriosis, Hemorrhoid, Nasal congestion (10/06/2017), Osteoporosis, PONV (postoperative nausea and vomiting), Psoriasis, and Seasonal allergies.  Anxiety and depression -Xanax 0.5 mg 3 times daily. Today she reports that over the last year she has had a friend of 34 years pass away, her mother has to have gallbladder surgery and she found out she had SCC. Her depression has been up and down, some days she feels good and other days.   Tobacco Use -continues to smoke between a half a pack and 1 pack a day. She uses albuterol inhaler as needed for emphysema. Her last low dose CT done in 2024 showed no suspicious lung nodules.  Also showed aortic atherosclerosis and emphysema. She knows she needs to quit.  COPD -uses her inhaler maybe once a week.  Does not feel short of breath with exertion.  Aortic atherosclerosis-prescribe Lipitor 40 mg daily Lab Results  Component Value Date   CHOL 123 01/29/2022   HDL 61.90 01/29/2022   LDLCALC 43 01/29/2022   TRIG 91.0 01/29/2022   CHOLHDL 2 01/29/2022    Psoriasis - managed by Dermatology with Stelera 45 mg.    All immunizations and health maintenance protocols were reviewed with the patient and needed orders were placed. Refuses vaccinations.   Appropriate screening laboratory values were ordered for the patient including screening of hyperlipidemia, renal function and hepatic function. If indicated by BPH, a PSA was ordered.  Medication reconciliation,  past medical history, social history, problem list and allergies were reviewed  in detail with the patient  Goals were established with regard to weight loss, exercise, and  diet in compliance with medications Wt Readings from Last 3 Encounters:  03/18/23 92 lb (41.7 kg)  09/15/22 88 lb 13.5 oz (40.3 kg)  06/25/22 89 lb 2 oz (40.4 kg)    She is overdue for colon cancer screening and mammogram.   Review of Systems  Constitutional: Negative.   HENT: Negative.    Eyes: Negative.   Respiratory: Negative.    Cardiovascular: Negative.   Gastrointestinal: Negative.   Endocrine: Negative.   Genitourinary: Negative.   Musculoskeletal: Negative.   Skin: Negative.   Allergic/Immunologic: Negative.   Neurological: Negative.   Hematological: Negative.   Psychiatric/Behavioral: Negative.     Past Medical History:  Diagnosis Date   Allergy    Anemia    "borderline"   Anxiety    Arthritis    Asthma    "bronchial asthmatic, flares up seasonally"   Calcification of aorta (HCC)    Abdominal aorta   CIN I (cervical intraepithelial neoplasia I)    Depression    Diverticulosis    Dysplastic colon polyp    Endometriosis    Hemorrhoid    Nasal congestion 10/06/2017   Osteoporosis    PONV (postoperative nausea and vomiting)    Psoriasis    Seasonal allergies     Social History   Socioeconomic History   Marital status: Married    Spouse name: Not on file   Number of children: Not on file   Years of education: Not on file   Highest education level: Not on  file  Occupational History   Not on file  Tobacco Use   Smoking status: Every Day    Current packs/day: 0.75    Types: Cigarettes   Smokeless tobacco: Never  Vaping Use   Vaping status: Never Used  Substance and Sexual Activity   Alcohol use: Yes    Alcohol/week: 5.0 standard drinks of alcohol    Types: 5 Cans of beer per week    Comment: "social"   Drug use: No   Sexual activity: Yes    Birth control/protection: Surgical  Other Topics Concern   Not on file  Social History Narrative   Not on  file   Social Drivers of Health   Financial Resource Strain: Not on file  Food Insecurity: Low Risk  (10/27/2022)   Received from [facility]   Hunger Vital Sign    Worried About Running Out of Food in the Last Year: Never true    Ran Out of Food in the Last Year: Never true  Transportation Needs: No Transportation Needs (10/27/2022)   Received from [facility]    In the past 12 months, has lack of reliable transportation kept you from medical appointments, meetings, work or from getting things needed for daily living? : No  Physical Activity: Not on file  Stress: Not on file  Social Connections: Unknown (08/21/2021)   Received from [facility]   Social Network    Social Network: Not on file  Intimate Partner Violence: Unknown (08/21/2021)   Received from [facility]   HITS    Physically Hurt: Not on file    Insult or Talk Down To: Not on file    Threaten Physical Harm: Not on file    Scream or Curse: Not on file    Past Surgical History:  Procedure Laterality Date   ABDOMINAL HYSTERECTOMY  2000   TAH,LSO (RSO IN 2002)   APPENDECTOMY  1982   LYSIS OF BOWEL ADHESIONS   COLON SURGERY     "small amount of colon taken at time of oophorectomy in 2002"   COLONOSCOPY     COLPOSCOPY     GYNECOLOGIC CRYOSURGERY     LIPOMA EXCISION     removed from back and right foot"done at different times"   LUMBAR DISC SURGERY  06/09/2019   MASS EXCISION  12/24/2011   Procedure: EXCISION MASS;  Surgeon: Nadara Mustard, MD;  Location: [facility] OR;  Service: Orthopedics;  Laterality: Right;  Right Foot Excision Soft Tissue Mass   MASS EXCISION Right 09/15/2022   Procedure: EXCISION MASS DORSUM RIGHT HAND;  Surgeon: Betha Loa, MD;  Location: [facility];  Service: Orthopedics;  Laterality: Right;   OOPHORECTOMY  2002   DX LAP W/RSO   OOPHORECTOMY  2000   TAH W LSO   PELVIC LAPAROSCOPY     DL   POLYPECTOMY     TONSILLECTOMY       Family History  Problem Relation Age of Onset   Diabetes Mother    Hypertension Mother    Lymphoma Father    Cancer Father    Lymphoma Paternal Aunt    Diabetes Maternal Grandmother    Hypertension Maternal Grandmother    Heart disease Maternal Grandmother    Colon polyps Neg Hx    Crohn's disease Neg Hx    Esophageal cancer Neg Hx    Rectal cancer Neg Hx    Stomach cancer Neg Hx     Allergies  Allergen Reactions   Codeine Nausea Only   Morphine And Codeine Nausea Only    Current Outpatient Medications on File Prior to Visit  Medication Sig Dispense Refill   albuterol (VENTOLIN HFA) 108 (90 Base) MCG/ACT inhaler Inhale 3 puffs into the lungs every 4 (four) hours as needed for wheezing or shortness of breath. 6.7 each 3   ALPRAZolam (XANAX) 1 MG tablet TAKE 0.5 TABLETS (0.5 MG TOTAL) BY MOUTH 3 (THREE) TIMES DAILY. 45 tablet 2   atorvastatin (LIPITOR) 40 MG tablet TAKE 1 TABLET BY MOUTH EVERY DAY 30 tablet 1   No current facility-administered medications on file prior to visit.    BP 120/80   Pulse (!) 54   Temp 97.6 F (36.4 C) (Oral)   Wt 92 lb (41.7 kg)   SpO2 94%   BMI 14.85 kg/m       Objective:   Physical Exam Vitals and nursing note reviewed.  Constitutional:      General: She is not in acute distress.    Appearance: Normal appearance. She is not ill-appearing.  HENT:     Head: Normocephalic and atraumatic.     Right Ear: Tympanic membrane, ear canal and external ear normal. There is no impacted cerumen.     Left Ear: Tympanic membrane, ear canal and external ear normal. There is no impacted cerumen.     Nose: Nose normal. No congestion or rhinorrhea.     Mouth/Throat:     Mouth: Mucous membranes are moist.     Pharynx: Oropharynx is clear.  Eyes:     Extraocular Movements: Extraocular movements intact.     Conjunctiva/sclera: Conjunctivae normal.     Pupils: Pupils are equal, round, and reactive to light.  Neck:     Vascular: No carotid  bruit.  Cardiovascular:     Rate and Rhythm: Normal rate and regular rhythm.     Pulses: Normal pulses.     Heart sounds: No murmur heard.    No friction rub. No gallop.  Pulmonary:     Effort: Pulmonary effort is normal.     Breath sounds: Normal breath sounds.  Abdominal:     General: Abdomen is flat. Bowel sounds are normal. There is no distension.     Palpations: Abdomen is soft. There is no mass.     Tenderness: There is no abdominal tenderness. There is no guarding or rebound.     Hernia: No hernia is present.  Musculoskeletal:        General: Normal range of motion.     Cervical back: Normal range of motion and neck supple.  Lymphadenopathy:     Cervical: No cervical adenopathy.  Skin:    General: Skin is warm and dry.     Capillary Refill: Capillary refill takes less than 2 seconds.  Neurological:     General: No focal deficit present.     Mental Status: She is alert and oriented to person, place, and time.  Psychiatric:        Mood and Affect: Mood normal.        Behavior: Behavior normal.        Thought Content: Thought content normal.        Judgment: Judgment normal.        Assessment & Plan:  1. Routine general medical examination at a health care facility (Primary) Today patient counseled on age appropriate routine health concerns for screening and prevention, each reviewed and up to date or declined. Immunizations reviewed  and up to date or declined. Labs ordered and reviewed. Risk factors for depression reviewed and negative. Hearing function and visual acuity are intact. ADLs screened and addressed as needed. Functional ability and level of safety reviewed and appropriate. Education, counseling and referrals performed based on assessed risks today. Patient provided with a copy of personalized plan for preventive services. - Follow up in one year  - She will call and schedule mammogram and colonoscopy when ready.   2. Adjustment disorder with mixed anxiety and  depressed mood - She has Remeron at home. She will let me know if she starts this  - CBC with Differential/Platelet; Future - Comprehensive metabolic panel; Future - Lipid panel; Future - TSH; Future - ALPRAZolam (XANAX) 1 MG tablet; Take 0.5 tablets (0.5 mg total) by mouth 3 (three) times daily.  Dispense: 45 tablet; Refill: 2  3. Tobacco use - Encouraged to quit smoking   4. Pulmonary emphysema, unspecified emphysema type (HCC) - Continue with rescue inhaler as needed - CBC with Differential/Platelet; Future - Comprehensive metabolic panel; Future - Lipid panel; Future - TSH; Future  5. Aortic atherosclerosis (HCC) - Consider increase in statin  - CBC with Differential/Platelet; Future - Comprehensive metabolic panel; Future - Lipid panel; Future - TSH; Future  6. Psoriasis - Per dermatology  - CBC with Differential/Platelet; Future - Comprehensive metabolic panel; Future - Lipid panel; Future - TSH; Future  Susan Frees, NP

## 2023-03-26 ENCOUNTER — Encounter: Payer: Self-pay | Admitting: Adult Health

## 2023-03-26 ENCOUNTER — Other Ambulatory Visit (INDEPENDENT_AMBULATORY_CARE_PROVIDER_SITE_OTHER)

## 2023-03-26 DIAGNOSIS — J439 Emphysema, unspecified: Secondary | ICD-10-CM

## 2023-03-26 DIAGNOSIS — F4323 Adjustment disorder with mixed anxiety and depressed mood: Secondary | ICD-10-CM

## 2023-03-26 DIAGNOSIS — I700 Atherosclerosis of aorta: Secondary | ICD-10-CM

## 2023-03-26 DIAGNOSIS — L409 Psoriasis, unspecified: Secondary | ICD-10-CM | POA: Diagnosis not present

## 2023-03-26 LAB — LIPID PANEL
Cholesterol: 122 mg/dL (ref 0–200)
HDL: 65.9 mg/dL (ref >39.00)
LDL Cholesterol: 43 mg/dL (ref 0–99)
NonHDL: 56.35
Total CHOL/HDL Ratio: 2
Triglycerides: 65 mg/dL (ref 0.0–149.0)
VLDL: 13 mg/dL (ref 0.0–40.0)

## 2023-03-26 LAB — COMPREHENSIVE METABOLIC PANEL WITH GFR
ALT: 13 U/L (ref 0–35)
AST: 29 U/L (ref 0–37)
Albumin: 4.6 g/dL (ref 3.5–5.2)
Alkaline Phosphatase: 84 U/L (ref 39–117)
BUN: 11 mg/dL (ref 6–23)
CO2: 30 meq/L (ref 19–32)
Calcium: 9.3 mg/dL (ref 8.4–10.5)
Chloride: 99 meq/L (ref 96–112)
Creatinine, Ser: 0.64 mg/dL (ref 0.40–1.20)
GFR: 93.17 mL/min
Glucose, Bld: 98 mg/dL (ref 70–99)
Potassium: 4.8 meq/L (ref 3.5–5.1)
Sodium: 136 meq/L (ref 135–145)
Total Bilirubin: 0.7 mg/dL (ref 0.2–1.2)
Total Protein: 7.2 g/dL (ref 6.0–8.3)

## 2023-03-26 LAB — CBC WITH DIFFERENTIAL/PLATELET
Basophils Absolute: 0 K/uL (ref 0.0–0.1)
Basophils Relative: 0.5 % (ref 0.0–3.0)
Eosinophils Absolute: 0.1 K/uL (ref 0.0–0.7)
Eosinophils Relative: 1.2 % (ref 0.0–5.0)
HCT: 42.7 % (ref 36.0–46.0)
Hemoglobin: 14 g/dL (ref 12.0–15.0)
Lymphocytes Relative: 25 % (ref 12.0–46.0)
Lymphs Abs: 2.2 K/uL (ref 0.7–4.0)
MCHC: 32.7 g/dL (ref 30.0–36.0)
MCV: 92.7 fl (ref 78.0–100.0)
Monocytes Absolute: 0.7 K/uL (ref 0.1–1.0)
Monocytes Relative: 7.6 % (ref 3.0–12.0)
Neutro Abs: 5.7 K/uL (ref 1.4–7.7)
Neutrophils Relative %: 65.7 % (ref 43.0–77.0)
Platelets: 254 K/uL (ref 150.0–400.0)
RBC: 4.6 Mil/uL (ref 3.87–5.11)
RDW: 14.5 % (ref 11.5–15.5)
WBC: 8.7 K/uL (ref 4.0–10.5)

## 2023-03-26 LAB — TSH: TSH: 2.18 u[IU]/mL (ref 0.35–5.50)

## 2023-05-09 ENCOUNTER — Other Ambulatory Visit: Payer: Self-pay | Admitting: Adult Health

## 2023-05-22 ENCOUNTER — Telehealth: Payer: Self-pay | Admitting: Adult Health

## 2023-05-22 NOTE — Telephone Encounter (Signed)
 Let pt know she can call billing about getting the charges refiled.  The number is [phone].

## 2023-05-22 NOTE — Telephone Encounter (Signed)
 Copied from CRM [id number]. Topic: General - Other >> May 21, 2023  1:17 PM Jenice Mitts wrote: Reason for CRM: Patient is calling for a few things she can be reached at [phone]  1. Her insurance is billing her $68 for her blood work that was supposed to be apart of her annual exam so she would like to see if it can be fixed and rebilled.  2. She would also like a call about her latest blood results  3. She would like to know if NP Randel Buss can give her recommendations on kidney doctors for her mom who has stage 3 kidney failure

## 2023-05-27 NOTE — Telephone Encounter (Signed)
 Please advise on questions 1 and 2. Not sure if I need to send to [facility] or pt need to talk to billing.

## 2023-05-27 NOTE — Telephone Encounter (Signed)
 Pt notified of update and was assisted with Mychart login as well.

## 2023-06-18 ENCOUNTER — Other Ambulatory Visit: Payer: Self-pay | Admitting: Adult Health

## 2023-06-18 DIAGNOSIS — F4323 Adjustment disorder with mixed anxiety and depressed mood: Secondary | ICD-10-CM

## 2023-06-18 DIAGNOSIS — R634 Abnormal weight loss: Secondary | ICD-10-CM

## 2023-07-14 ENCOUNTER — Other Ambulatory Visit: Payer: Self-pay | Admitting: Adult Health

## 2023-09-10 ENCOUNTER — Encounter: Payer: Self-pay | Admitting: Internal Medicine

## 2023-09-16 ENCOUNTER — Encounter: Payer: Self-pay | Admitting: Acute Care

## 2023-09-17 ENCOUNTER — Telehealth (HOSPITAL_BASED_OUTPATIENT_CLINIC_OR_DEPARTMENT_OTHER): Payer: Self-pay

## 2023-09-17 NOTE — Telephone Encounter (Signed)
 Copied from CRM [id number]. Topic: Clinical - Request for Lab/Test Order >> Sep 16, 2023  1:03 PM [name] wrote: Reason for CRM: has been trying to get ahold of Lung Cancer Screening department to schedule for herself and her husband Susan Aguirre 01/08/1959 - please follow up at [phone]

## 2023-09-18 ENCOUNTER — Other Ambulatory Visit: Payer: Self-pay | Admitting: Adult Health

## 2023-09-18 DIAGNOSIS — F4323 Adjustment disorder with mixed anxiety and depressed mood: Secondary | ICD-10-CM

## 2023-09-22 NOTE — Telephone Encounter (Signed)
 Okay for refill?

## 2023-09-23 ENCOUNTER — Other Ambulatory Visit: Payer: Self-pay | Admitting: Acute Care

## 2023-09-23 DIAGNOSIS — F17210 Nicotine dependence, cigarettes, uncomplicated: Secondary | ICD-10-CM

## 2023-09-23 DIAGNOSIS — Z87891 Personal history of nicotine dependence: Secondary | ICD-10-CM

## 2023-09-23 DIAGNOSIS — Z122 Encounter for screening for malignant neoplasm of respiratory organs: Secondary | ICD-10-CM

## 2023-09-23 NOTE — Telephone Encounter (Signed)
 Patient has been scheduled for her and her husband

## 2023-10-01 ENCOUNTER — Ambulatory Visit (AMBULATORY_SURGERY_CENTER): Payer: Self-pay

## 2023-10-01 ENCOUNTER — Encounter: Payer: Self-pay | Admitting: Internal Medicine

## 2023-10-01 VITALS — Ht 66.0 in | Wt 90.8 lb

## 2023-10-01 DIAGNOSIS — Z860100 Personal history of colon polyps, unspecified: Secondary | ICD-10-CM

## 2023-10-01 MED ORDER — NA SULFATE-K SULFATE-MG SULF 17.5-3.13-1.6 GM/177ML PO SOLN: 1.0000 | Freq: Once | ORAL | 0 refills | Status: AC

## 2023-10-01 NOTE — Progress Notes (Signed)

## 2023-10-15 ENCOUNTER — Encounter: Payer: Self-pay | Admitting: Internal Medicine

## 2023-10-15 ENCOUNTER — Ambulatory Visit (AMBULATORY_SURGERY_CENTER): Admitting: Internal Medicine

## 2023-10-15 VITALS — BP 124/79 | HR 87 | Temp 98.1°F | Resp 21 | Ht 66.0 in | Wt 90.0 lb

## 2023-10-15 DIAGNOSIS — D120 Benign neoplasm of cecum: Secondary | ICD-10-CM

## 2023-10-15 DIAGNOSIS — Z1211 Encounter for screening for malignant neoplasm of colon: Secondary | ICD-10-CM

## 2023-10-15 DIAGNOSIS — F419 Anxiety disorder, unspecified: Secondary | ICD-10-CM | POA: Diagnosis not present

## 2023-10-15 DIAGNOSIS — Z860100 Personal history of colon polyps, unspecified: Secondary | ICD-10-CM

## 2023-10-15 DIAGNOSIS — Z860101 Personal history of adenomatous and serrated colon polyps: Secondary | ICD-10-CM | POA: Diagnosis not present

## 2023-10-15 DIAGNOSIS — K56699 Other intestinal obstruction unspecified as to partial versus complete obstruction: Secondary | ICD-10-CM | POA: Diagnosis not present

## 2023-10-15 DIAGNOSIS — K648 Other hemorrhoids: Secondary | ICD-10-CM | POA: Diagnosis not present

## 2023-10-15 DIAGNOSIS — F32A Depression, unspecified: Secondary | ICD-10-CM | POA: Diagnosis not present

## 2023-10-15 MED ORDER — SODIUM CHLORIDE 0.9 % IV SOLN: 500.0000 mL | INTRAVENOUS | Status: DC

## 2023-10-15 NOTE — Progress Notes (Signed)
 Called to room to assist during endoscopic procedure.  Patient ID and intended procedure confirmed with present staff. Received instructions for my participation in the procedure from the performing physician.

## 2023-10-15 NOTE — Patient Instructions (Signed)
 Please read handouts provided. Continue present medications. Await pathology results. Resume previous diet. Repeat colonoscopy in 5 years for screening.   YOU HAD AN ENDOSCOPIC PROCEDURE TODAY AT THE [facility]:   Refer to the procedure report that was given to you for any specific questions about what was found during the examination.  If the procedure report does not answer your questions, please call your gastroenterologist to clarify.  If you requested that your care partner not be given the details of your procedure findings, then the procedure report has been included in a sealed envelope for you to review at your convenience later.  YOU SHOULD EXPECT: Some feelings of bloating in the abdomen. Passage of more gas than usual.  Walking can help get rid of the air that was put into your GI tract during the procedure and reduce the bloating. If you had a lower endoscopy (such as a colonoscopy or flexible sigmoidoscopy) you may notice spotting of blood in your stool or on the toilet paper. If you underwent a bowel prep for your procedure, you may not have a normal bowel movement for a few days.  Please Note:  You might notice some irritation and congestion in your nose or some drainage.  This is from the oxygen used during your procedure.  There is no need for concern and it should clear up in a day or so.  SYMPTOMS TO REPORT IMMEDIATELY:  Following lower endoscopy (colonoscopy or flexible sigmoidoscopy):  Excessive amounts of blood in the stool  Significant tenderness or worsening of abdominal pains  Swelling of the abdomen that is new, acute  Fever of 100F or higher.  For urgent or emergent issues, a gastroenterologist can be reached at any hour by calling [phone]. Do not use MyChart messaging for urgent concerns.    DIET:  We do recommend a small meal at first, but then you may proceed to your regular diet.  Drink plenty of fluids but you should avoid alcoholic  beverages for 24 hours.  ACTIVITY:  You should plan to take it easy for the rest of today and you should NOT DRIVE or use heavy machinery until tomorrow (because of the sedation medicines used during the test).    FOLLOW UP: Our staff will call the number listed on your records the next business day following your procedure.  We will call around 7:15- 8:00 am to check on you and address any questions or concerns that you may have regarding the information given to you following your procedure. If we do not reach you, we will leave a message.     If any biopsies were taken you will be contacted by phone or by letter within the next 1-3 weeks.  Please call us at [phone] if you have not heard about the biopsies in 3 weeks.    SIGNATURES/CONFIDENTIALITY: You and/or your care partner have signed paperwork which will be entered into your electronic medical record.  These signatures attest to the fact that that the information above on your After Visit Summary has been reviewed and is understood.  Full responsibility of the confidentiality of this discharge information lies with you and/or your care-partner.

## 2023-10-15 NOTE — Progress Notes (Signed)
 HISTORY OF PRESENT ILLNESS:  Susan Aguirre is a 63 y.o. female with a history of multiple adenomatous polyps.  Presents today for surveillance colonoscopy.  No complaints  REVIEW OF SYSTEMS:  All non-GI ROS negative except for  Past Medical History:  Diagnosis Date   Allergy    Anemia    borderline   Anxiety    Arthritis    Asthma    bronchial asthmatic, flares up seasonally   Calcification of aorta    Abdominal aorta   CIN I (cervical intraepithelial neoplasia I)    Depression    Diverticulosis    Dysplastic colon polyp    Endometriosis    Hemorrhoid    Nasal congestion 10/06/2017   Osteoporosis    PONV (postoperative nausea and vomiting)    Psoriasis    Seasonal allergies     Past Surgical History:  Procedure Laterality Date   ABDOMINAL HYSTERECTOMY  2000   TAH,LSO (RSO IN 2002)   APPENDECTOMY  1982   LYSIS OF BOWEL ADHESIONS   COLON SURGERY     small amount of colon taken at time of oophorectomy in 2002   COLONOSCOPY     COLPOSCOPY     GYNECOLOGIC CRYOSURGERY     LIPOMA EXCISION     removed from back and right footdone at different times   LUMBAR DISC SURGERY  06/09/2019   MASS EXCISION  12/24/2011   Procedure: EXCISION MASS;  Surgeon: Jerona LULLA Sage, MD;  Location: [facility] OR;  Service: Orthopedics;  Laterality: Right;  Right Foot Excision Soft Tissue Mass   MASS EXCISION Right 09/15/2022   Procedure: EXCISION MASS DORSUM RIGHT HAND;  Surgeon: Murrell Drivers, MD;  Location: [facility];  Service: Orthopedics;  Laterality: Right;   OOPHORECTOMY  2002   DX LAP W/RSO   OOPHORECTOMY  2000   TAH W LSO   PELVIC LAPAROSCOPY     DL   POLYPECTOMY     TONSILLECTOMY      Social History Susan Aguirre  reports that she has been smoking cigarettes. She has never used smokeless tobacco. She reports current alcohol use of about 5.0 standard drinks of alcohol per week. She reports that she does not use drugs.  family history includes  Cancer in her father; Diabetes in her maternal grandmother and mother; Heart disease in her maternal grandmother; Hypertension in her maternal grandmother and mother; Lymphoma in her father and paternal aunt.  Allergies  Allergen Reactions   Codeine Nausea Only   Morphine And Codeine Nausea Only       PHYSICAL EXAMINATION: Vital signs: BP 139/83   Pulse 60   Temp 98.1 F (36.7 C) (Temporal)   Resp 17   Ht 5' 6 (1.676 m)   Wt 90 lb (40.8 kg)   SpO2 97%   BMI 14.53 kg/m  General: Well-developed, well-nourished, no acute distress HEENT: Sclerae are anicteric, conjunctiva pink. Oral mucosa intact Lungs: Clear Heart: Regular Abdomen: soft, nontender, nondistended, no obvious ascites, no peritoneal signs, normal bowel sounds. No organomegaly. Extremities: No edema Psychiatric: alert and oriented x3. Cooperative     ASSESSMENT: History of multiple adenomatous polyps    PLAN: Surveillance colonoscopy

## 2023-10-15 NOTE — Progress Notes (Signed)
 Pt's states no medical or surgical changes since previsit or office visit.

## 2023-10-15 NOTE — Op Note (Signed)
 [facility] Patient Name: Susan Aguirre Procedure Date: 10/15/2023 10:25 AM MRN: [id number] Endoscopist: Norleen SAILOR. Abran , MD, [id number] Age: 63 Referring MD:  Date of Birth: October 09, 1960 Gender: Female Account #: [id number] Procedure:                Colonoscopy with cold snare polypectomy x 1 Indications:              High risk colon cancer surveillance: Personal                            history of multiple (3 or more) adenomas. Previous                            examinations 1999 and 2009 (elsewhere), 2013, 2019 Medicines:                Monitored Anesthesia Care Procedure:                Pre-Anesthesia Assessment:                           - Prior to the procedure, a History and Physical                            was performed, and patient medications and                            allergies were reviewed. The patient's tolerance of                            previous anesthesia was also reviewed. The risks                            and benefits of the procedure and the sedation                            options and risks were discussed with the patient.                            All questions were answered, and informed consent                            was obtained. Prior Anticoagulants: The patient has                            taken no anticoagulant or antiplatelet agents. ASA                            Grade Assessment: II - A patient with mild systemic                            disease. After reviewing the risks and benefits,                            the patient was deemed in satisfactory condition to  undergo the procedure.                           After obtaining informed consent, the colonoscope                            was passed under direct vision. Throughout the                            procedure, the patient's blood pressure, pulse, and                            oxygen saturations were monitored continuously. The                             [id number]-[id number] Slim SN [id number] was introduced through                            the anus and advanced to the the cecum, identified                            by appendiceal orifice and ileocecal valve. The                            ileocecal valve, appendiceal orifice, and rectum                            were photographed. The quality of the bowel                            preparation was excellent. The colonoscopy was                            performed without difficulty. The patient tolerated                            the procedure well. The bowel preparation used was                            SUPREP via split dose instruction. Scope In: 10:43:42 AM Scope Out: 10:58:18 AM Scope Withdrawal Time: 0 hours 9 minutes 51 seconds  Total Procedure Duration: 0 hours 14 minutes 36 seconds  Findings:                 A 5 mm polyp was found in the cecum. The polyp was                            removed with a cold snare. Resection and retrieval                            were complete.                           There was marked rectosigmoid stenosis as  previously noted. Small internal hemorrhoids. The                            exam was otherwise without abnormality on direct                            and retroflexion views. Complications:            No immediate complications. Estimated blood loss:                            None. Estimated Blood Loss:     Estimated blood loss: none. Impression:               - One 5 mm polyp in the cecum, removed with a cold                            snare. Resected and retrieved.                           - Rectosigmoid stenosis. Internal hemorrhoids                           - The examination was otherwise normal on direct                            and retroflexion views. Recommendation:           - Repeat colonoscopy in 5 years for surveillance.                            ULTRASLIM PEDIATRIC SCOPE                            - Patient has a contact number available for                            emergencies. The signs and symptoms of potential                            delayed complications were discussed with the                            patient. Return to normal activities tomorrow.                            Written discharge instructions were provided to the                            patient.                           - Resume previous diet.                           - Continue present medications.                           -  Await pathology results. Norleen SAILOR. Abran, MD 10/15/2023 11:04:08 AM This report has been signed electronically.

## 2023-10-15 NOTE — Progress Notes (Signed)
 Report to PACU, RN, vss, BBS= Clear.

## 2023-10-16 ENCOUNTER — Telehealth: Payer: Self-pay

## 2023-10-16 NOTE — Telephone Encounter (Signed)
 Follow up call to pt, no answer.

## 2023-10-19 LAB — SURGICAL PATHOLOGY

## 2023-10-20 ENCOUNTER — Ambulatory Visit: Payer: Self-pay | Admitting: Internal Medicine

## 2023-10-27 ENCOUNTER — Ambulatory Visit
Admission: RE | Admit: 2023-10-27 | Discharge: 2023-10-27 | Disposition: A | Discharge status: Home / Self Care | Source: Ambulatory Visit | Attending: Adult Health | Admitting: Adult Health

## 2023-10-27 DIAGNOSIS — Z87891 Personal history of nicotine dependence: Secondary | ICD-10-CM

## 2023-10-27 DIAGNOSIS — F17210 Nicotine dependence, cigarettes, uncomplicated: Secondary | ICD-10-CM

## 2023-10-27 DIAGNOSIS — Z122 Encounter for screening for malignant neoplasm of respiratory organs: Secondary | ICD-10-CM

## 2023-11-02 ENCOUNTER — Other Ambulatory Visit: Payer: Self-pay

## 2023-11-02 DIAGNOSIS — Z122 Encounter for screening for malignant neoplasm of respiratory organs: Secondary | ICD-10-CM

## 2023-11-02 DIAGNOSIS — F17210 Nicotine dependence, cigarettes, uncomplicated: Secondary | ICD-10-CM

## 2023-11-02 DIAGNOSIS — Z87891 Personal history of nicotine dependence: Secondary | ICD-10-CM

## 2023-11-27 ENCOUNTER — Ambulatory Visit (INDEPENDENT_AMBULATORY_CARE_PROVIDER_SITE_OTHER)

## 2023-11-27 ENCOUNTER — Ambulatory Visit: Payer: Self-pay

## 2023-11-27 ENCOUNTER — Encounter (HOSPITAL_COMMUNITY): Payer: Self-pay

## 2023-11-27 ENCOUNTER — Ambulatory Visit (HOSPITAL_COMMUNITY): Payer: Self-pay | Admitting: Physician Assistant

## 2023-11-27 ENCOUNTER — Ambulatory Visit (HOSPITAL_COMMUNITY)
Admission: EM | Admit: 2023-11-27 | Discharge: 2023-11-27 | Disposition: A | Discharge status: Home / Self Care | Attending: Physician Assistant | Admitting: Physician Assistant

## 2023-11-27 DIAGNOSIS — L03011 Cellulitis of right finger: Secondary | ICD-10-CM

## 2023-11-27 DIAGNOSIS — M79641 Pain in right hand: Secondary | ICD-10-CM | POA: Diagnosis not present

## 2023-11-27 DIAGNOSIS — S60051A Contusion of right little finger without damage to nail, initial encounter: Secondary | ICD-10-CM | POA: Diagnosis not present

## 2023-11-27 DIAGNOSIS — M19041 Primary osteoarthritis, right hand: Secondary | ICD-10-CM | POA: Diagnosis not present

## 2023-11-27 MED ORDER — DOXYCYCLINE HYCLATE 100 MG PO CAPS: 100.0000 mg | ORAL_CAPSULE | Freq: Two times a day (BID) | ORAL | 0 refills | Status: AC

## 2023-11-27 MED ORDER — MUPIROCIN 2 % EX OINT: 1.0000 | TOPICAL_OINTMENT | Freq: Two times a day (BID) | CUTANEOUS | 0 refills | Status: AC

## 2023-11-27 NOTE — ED Provider Notes (Signed)
 [facility]    CSN: [id number] Arrival date & time: 11/27/23  1117      History   Chief Complaint Chief Complaint  Patient presents with   Fall    HPI Susan Aguirre is a 63 y.o. female.   Patient present today with a several day history of worsening right little finger pain.  She reports that she was outside feeding the cats when she took a misstep going up the stairs and fell forward hitting the ulnar side of her right hand onto a hard object.  She did not fall to the ground denies any head trauma.  She reports that she developed a blood blister that she then drained which provided some improvement of the pain but then has developed surrounding erythema and increasing pain prompting evaluation.  She denies any numbness or paresthesias in the hand.  She has not been taking any over-the-counter medications.  She reports that currently pain is rated 3 on a 0-10 pain scale but worsens to 10 with palpation of the proximal right little finger, described as pressure, no alleviating factors identified.  She has never injured her hand or had surgery on it before.  She denies any recent antibiotics.  She is right-handed.  Her last tetanus was updated 08/30/2021.    Past Medical History:  Diagnosis Date   Allergy    Anemia    borderline   Anxiety    Arthritis    Asthma    bronchial asthmatic, flares up seasonally   Calcification of aorta    Abdominal aorta   CIN I (cervical intraepithelial neoplasia I)    Depression    Diverticulosis    Dysplastic colon polyp    Endometriosis    Hemorrhoid    Nasal congestion 10/06/2017   Osteoporosis    PONV (postoperative nausea and vomiting)    Psoriasis    Seasonal allergies     Patient Active Problem List   Diagnosis Date Noted   Coronary artery calcification 11/04/2016   Tobacco use 11/04/2016   Adjustment disorder with mixed anxiety and depressed mood 02/21/2013   Colonic polyp 02/21/2011   CIN I (cervical  intraepithelial neoplasia I)    Hemorrhoid    Endometriosis     Past Surgical History:  Procedure Laterality Date   ABDOMINAL HYSTERECTOMY  2000   TAH,LSO (RSO IN 2002)   APPENDECTOMY  1982   LYSIS OF BOWEL ADHESIONS   COLON SURGERY     small amount of colon taken at time of oophorectomy in 2002   COLONOSCOPY     COLPOSCOPY     GYNECOLOGIC CRYOSURGERY     LIPOMA EXCISION     removed from back and right footdone at different times   LUMBAR DISC SURGERY  06/09/2019   MASS EXCISION  12/24/2011   Procedure: EXCISION MASS;  Surgeon: Jerona LULLA Sage, MD;  Location: [facility];  Service: Orthopedics;  Laterality: Right;  Right Foot Excision Soft Tissue Mass   MASS EXCISION Right 09/15/2022   Procedure: EXCISION MASS DORSUM RIGHT HAND;  Surgeon: Murrell Drivers, MD;  Location: [facility];  Service: Orthopedics;  Laterality: Right;   OOPHORECTOMY  2002   DX LAP W/RSO   OOPHORECTOMY  2000   TAH W LSO   PELVIC LAPAROSCOPY     DL   POLYPECTOMY     TONSILLECTOMY      OB History     Gravida  1   Para  Term      Preterm      AB  1   Living  0      SAB      IAB      Ectopic      Multiple      Live Births               Home Medications    Prior to Admission medications   Medication Sig Start Date End Date Taking? Authorizing Provider  doxycycline  (VIBRAMYCIN ) 100 MG capsule Take 1 capsule (100 mg total) by mouth 2 (two) times daily. 11/27/23  Yes Delona Clasby K, PA-C  mupirocin  ointment (BACTROBAN ) 2 % Apply 1 Application topically 2 (two) times daily. 11/27/23  Yes Lyvonne Cassell K, PA-C  albuterol  (VENTOLIN  HFA) 108 (90 Base) MCG/ACT inhaler Inhale 3 puffs into the lungs every 4 (four) hours as needed for wheezing or shortness of breath. 01/31/22   Nafziger, Darleene, NP  ALPRAZolam  (XANAX ) 1 MG tablet TAKE 0.5 TABLETS (0.5 MG TOTAL) BY MOUTH 3 (THREE) TIMES DAILY. 09/22/23 10/22/23  Nafziger, Darleene, NP  atorvastatin  (LIPITOR) 40 MG tablet TAKE 1 TABLET BY  MOUTH EVERY DAY 07/14/23   Merna Darleene, NP    Family History Family History  Problem Relation Age of Onset   Diabetes Mother    Hypertension Mother    Lymphoma Father    Cancer Father    Lymphoma Paternal Aunt    Diabetes Maternal Grandmother    Hypertension Maternal Grandmother    Heart disease Maternal Grandmother    Colon polyps Neg Hx    Crohn's disease Neg Hx    Esophageal cancer Neg Hx    Rectal cancer Neg Hx    Stomach cancer Neg Hx     Social History Social History   Tobacco Use   Smoking status: Every Day    Current packs/day: 0.75    Types: Cigarettes   Smokeless tobacco: Never  Vaping Use   Vaping status: Never Used  Substance Use Topics   Alcohol use: Yes    Alcohol/week: 5.0 standard drinks of alcohol    Types: 5 Cans of beer per week    Comment: social   Drug use: No     Allergies   Codeine and Morphine and codeine   Review of Systems Review of Systems  Constitutional:  Positive for activity change. Negative for appetite change, fatigue and fever.  Gastrointestinal:  Negative for diarrhea, nausea and vomiting.  Musculoskeletal:  Positive for arthralgias. Negative for myalgias.  Skin:  Positive for color change and wound.  Neurological:  Negative for weakness and numbness.     Physical Exam Triage Vital Signs ED Triage Vitals  Encounter Vitals Group     BP 11/27/23 1307 (!) 144/86     Girls Systolic BP Percentile --      Girls Diastolic BP Percentile --      Boys Systolic BP Percentile --      Boys Diastolic BP Percentile --      Pulse Rate 11/27/23 1307 71     Resp 11/27/23 1307 18     Temp 11/27/23 1307 97.8 F (36.6 C)     Temp Source 11/27/23 1307 Oral     SpO2 11/27/23 1307 96 %     Weight --      Height --      Head Circumference --      Peak Flow --      Pain Score 11/27/23 1306 7  Pain Loc --      Pain Education --      Exclude from Growth Chart --    No data found.  Updated Vital Signs BP (!) 144/86 (BP  Location: Right Arm)   Pulse 71   Temp 97.8 F (36.6 C) (Oral)   Resp 18   SpO2 96%   Visual Acuity Right Eye Distance:   Left Eye Distance:   Bilateral Distance:    Right Eye Near:   Left Eye Near:    Bilateral Near:     Physical Exam Vitals reviewed.  Constitutional:      General: She is awake. She is not in acute distress.    Appearance: Normal appearance. She is well-developed. She is not ill-appearing.     Comments: Very pleasant female presented age in no acute distress sitting comfortably in exam room  HENT:     Head: Normocephalic and atraumatic.  Cardiovascular:     Rate and Rhythm: Normal rate and regular rhythm.     Heart sounds: Normal heart sounds, S1 normal and S2 normal. No murmur heard.    Comments: Capillary refill within 2 seconds right fingers Pulmonary:     Effort: Pulmonary effort is normal.     Breath sounds: Normal breath sounds. No wheezing, rhonchi or rales.     Comments: Clear to auscultation bilaterally Musculoskeletal:     Right hand: Swelling and tenderness present. No bony tenderness. Normal range of motion. There is no disruption of two-point discrimination. Normal capillary refill.     Comments: Right hand: Tenderness palpation over proximal phalanx of right fifth finger and over MCP joint.  No deformity noted.  Normal flexion extension of the finger.  Approximately 2 cm x 0.5 cm hematoma noted proximal finger with surrounding erythema.  Finger is neurovascularly intact.  Psychiatric:        Behavior: Behavior is cooperative.       UC Treatments / Results  Labs (all labs ordered are listed, but only abnormal results are displayed) Labs Reviewed - No data to display  EKG   Radiology DG Hand Complete Right Result Date: 11/27/2023 CLINICAL DATA:  Fall.  Pain. EXAM: RIGHT HAND - COMPLETE 3+ VIEW COMPARISON:  None Available. FINDINGS: There is no evidence of fracture or dislocation. Mild osteoarthritis of the second through fifth  interphalangeal joints. Soft tissues are unremarkable. IMPRESSION: No acute osseous abnormality. Electronically Signed   By: Harrietta Sherry M.D.   On: 11/27/2023 13:55    Procedures Procedures (including critical care time)  Medications Ordered in UC Medications - No data to display  Initial Impression / Assessment and Plan / UC Course  I have reviewed the triage vital signs and the nursing notes.  Pertinent labs & imaging results that were available during my care of the patient were reviewed by me and considered in my medical decision making (see chart for details).     Patient is well-appearing, afebrile, nontoxic, nontachycardic.  He does neurovascular intact.  X-ray was obtained that showed no acute osseous abnormality.  I am concerned for cellulitis likely related to the opening of the blood blister as she has increasing pain and swelling and erythema surrounding this lesion.  Will cover with doxycycline  100 mg twice daily for 10 days.  We discussed that she should avoid prolonged sun exposure while on this medication due to associated photosensitivity.  She was also encouraged to keep the area clean and apply Bactroban  ointment with dressing changes.  She can use  over-the-counter analgesics for pain relief.  We discussed that if this is not improving quickly with treatment plan she should follow-up with a hand specialist and was given the contact information for local provider with instruction to call to schedule an appointment.  We discussed that if anything worsens or changes she needs to be seen immediately.  Strict return precautions given.   Final Clinical Impressions(s) / UC Diagnoses   Final diagnoses:  Contusion of right little finger without damage to nail, initial encounter  Cellulitis of right little finger     Discharge Instructions      I did not see anything broken on your x-ray.  I will contact you if the radiologist sees something else and we need to change her  treatment plan.  Keep this area clean with soap and water.  Apply Bactroban  ointment with dressing changes.  Start doxycycline  100 mg twice daily for 10 days.  Stay out of the sun while on this medication.  Keep your hand elevated when you are at home.  Use Tylenol  for pain relief.  If your symptoms are not improving quickly within a few days please follow-up with hand specialist; call to schedule an appointment.  If anything worsens and you have increasing pain, fever, numbness or tingling in the finger, discoloration of the hand you need to be seen immediately.     ED Prescriptions     Medication Sig Dispense Auth. Provider   doxycycline  (VIBRAMYCIN ) 100 MG capsule Take 1 capsule (100 mg total) by mouth 2 (two) times daily. 20 capsule Winferd Wease K, PA-C   mupirocin  ointment (BACTROBAN ) 2 % Apply 1 Application topically 2 (two) times daily. 22 g Lynasia Meloche K, PA-C      PDMP not reviewed this encounter.   Sherrell Rocky POUR, PA-C 11/27/23 1657

## 2023-11-27 NOTE — ED Triage Notes (Signed)
 Pt states tripped and fell Wednesday morning and grabbed something on the way down. Pt c/o rt hand pain and a blood blister to rt pinky. States sensitive to touch with swelling.

## 2023-11-27 NOTE — Discharge Instructions (Addendum)
 I did not see anything broken on your x-ray.  I will contact you if the radiologist sees something else and we need to change her treatment plan.  Keep this area clean with soap and water.  Apply Bactroban  ointment with dressing changes.  Start doxycycline  100 mg twice daily for 10 days.  Stay out of the sun while on this medication.  Keep your hand elevated when you are at home.  Use Tylenol  for pain relief.  If your symptoms are not improving quickly within a few days please follow-up with hand specialist; call to schedule an appointment.  If anything worsens and you have increasing pain, fever, numbness or tingling in the finger, discoloration of the hand you need to be seen immediately.

## 2023-11-27 NOTE — Telephone Encounter (Signed)
 FYI Only or Action Required?: FYI only for provider: patient agreeable to go to UC.  Patient was last seen in primary care on 03/18/2023 by Merna Huxley, NP.  Called Nurse Triage reporting Hand Injury.  Symptoms began Wednesday.  Interventions attempted: Other: patient states she drained her blood blister on her finger (used a needle to lacerate/drain).  Symptoms are: right pinky finger redness, swelling, blood blister at base of finger and severe pain when touching finger gradually worsening.  Triage Disposition: See Physician Within 24 Hours  Patient/caregiver understands and will follow disposition?: Yes             Copied from CRM #[id number]. Topic: Clinical - Red Word Triage >> Nov 27, 2023 10:17 AM Susan Aguirre wrote: Red Word that prompted transfer to Nurse Triage: Pt tripped Wednesday morning and caught herself with her R hand. Pinky finger is swollen, red, and has a blood blister. Warm transfer to NT. Reason for Disposition  [1] MODERATE pain (e.g., interferes with normal activities) AND [2] high-risk adult (e.g., age > 60 years, osteoporosis, chronic steroid use)  Answer Assessment - Initial Assessment Questions 1. MECHANISM: How did the injury happen?     Patient was walking up a few steps and lost her balance or mis stepped and caught herself from falling on her right hand and pinky finger.  2. ONSET: When did the injury happen? (e.g., minutes, hours ago)      Wednesday.  3. APPEARANCE of INJURY: What does the injury look like?      Swelling, red and blood blister (purple) noted to right pinky finger.  4. SEVERITY: Can you use your hand normally? Can you bend your fingers into a ball and then fully open them?     She can not use her right hand normally, she is able to bend and straighten the right pinky but there is sensitivity.  5. SIZE: For cuts, bruises, or swelling, ask: How large is it? (e.g., inches or centimeters; entire hand)      Entire right  pinky is swollen and goes down into the right hand, 1/4 of the finger is red, blood blister at base of finger.  6. PAIN: How bad is the pain? (Scale 0-10; or none, mild, moderate, severe)     10/10 with certain touching/movement. No pain at rest.  7. TETANUS: For any breaks in the skin, ask: When was your last tetanus booster?     N/A.  8. OTHER SYMPTOMS: Do you have any other symptoms?      No.  Protocols used: Hand Injury-A-AH

## 2024-01-01 ENCOUNTER — Other Ambulatory Visit: Payer: Self-pay | Admitting: Adult Health

## 2024-01-01 DIAGNOSIS — F4323 Adjustment disorder with mixed anxiety and depressed mood: Secondary | ICD-10-CM

## 2024-01-06 ENCOUNTER — Encounter: Payer: Self-pay | Admitting: Family Medicine

## 2024-01-06 ENCOUNTER — Ambulatory Visit: Admitting: Family Medicine

## 2024-01-06 VITALS — BP 96/60 | HR 86 | Temp 97.5°F | Wt 94.4 lb

## 2024-01-06 DIAGNOSIS — R519 Headache, unspecified: Secondary | ICD-10-CM

## 2024-01-06 NOTE — Patient Instructions (Signed)
 I will be setting up CT head to further evaluate.

## 2024-01-06 NOTE — Progress Notes (Signed)
 "  Established Patient Office Visit  Subjective   Patient ID: Susan Aguirre, female    DOB: 05-05-1960  Age: 63 y.o. MRN: [id number]  Chief Complaint  Patient presents with   Headache    HPI   [name] is a 63 year old female with longstanding history of tobacco use who is seen with at least 91-month history of pain left parieto-occipital region.  She 1 night was in bed with her head slightly tilted with pillows and had noticed the pain at that time.  She denies any injury.  Has noticed occasional soreness to the touch of the scalp but has not noted any new rashes.  She has history of reported psoriasis.  She describes her pain as a pressure-like sensation for least 2 months but symptoms are somewhat intermittent.  No clear exacerbating factors.  Denies any neck pain.  No nausea or vomiting.  No visual changes.  No appetite or weight changes.  No cognitive changes.  No focal weakness.  No history of malignancy.  She recently has noticed a little bit of clear rhinorrhea just from the left nostril and on 1 occasion particularly noticed some very salty taste in had read online that this could be related to CSF leak.  Past Medical History:  Diagnosis Date   Allergy    Anemia    borderline   Anxiety    Arthritis    Asthma    bronchial asthmatic, flares up seasonally   Calcification of aorta    Abdominal aorta   CIN I (cervical intraepithelial neoplasia I)    Depression    Diverticulosis    Dysplastic colon polyp    Endometriosis    Hemorrhoid    Nasal congestion 10/06/2017   Osteoporosis    PONV (postoperative nausea and vomiting)    Psoriasis    Seasonal allergies    Past Surgical History:  Procedure Laterality Date   ABDOMINAL HYSTERECTOMY  2000   TAH,LSO (RSO IN 2002)   APPENDECTOMY  1982   LYSIS OF BOWEL ADHESIONS   COLON SURGERY     small amount of colon taken at time of oophorectomy in 2002   COLONOSCOPY     COLPOSCOPY     GYNECOLOGIC CRYOSURGERY      LIPOMA EXCISION     removed from back and right footdone at different times   LUMBAR DISC SURGERY  06/09/2019   MASS EXCISION  12/24/2011   Procedure: EXCISION MASS;  Surgeon: Jerona LULLA Sage, MD;  Location: [facility];  Service: Orthopedics;  Laterality: Right;  Right Foot Excision Soft Tissue Mass   MASS EXCISION Right 09/15/2022   Procedure: EXCISION MASS DORSUM RIGHT HAND;  Surgeon: Murrell Drivers, MD;  Location: [facility];  Service: Orthopedics;  Laterality: Right;   OOPHORECTOMY  2002   DX LAP W/RSO   OOPHORECTOMY  2000   TAH W LSO   PELVIC LAPAROSCOPY     DL   POLYPECTOMY     TONSILLECTOMY      reports that she has been smoking cigarettes. She has never used smokeless tobacco. She reports current alcohol use of about 5.0 standard drinks of alcohol per week. She reports that she does not use drugs. family history includes Cancer in her father; Diabetes in her maternal grandmother and mother; Heart disease in her maternal grandmother; Hypertension in her maternal grandmother and mother; Lymphoma in her father and paternal aunt. Allergies[1]  Review of Systems  Constitutional:  Negative for chills and fever.  Eyes:  Negative for blurred vision and double vision.  Cardiovascular:  Negative for chest pain.  Neurological:  Positive for headaches. Negative for dizziness, sensory change, speech change, focal weakness, seizures, loss of consciousness and weakness.      Objective:     BP 96/60   Pulse 86   Temp (!) 97.5 F (36.4 C) (Oral)   Wt 94 lb 6.4 oz (42.8 kg)   SpO2 95%   BMI 15.24 kg/m  BP Readings from Last 3 Encounters:  01/06/24 96/60  11/27/23 (!) 144/86  10/15/23 124/79   Wt Readings from Last 3 Encounters:  01/06/24 94 lb 6.4 oz (42.8 kg)  10/15/23 90 lb (40.8 kg)  10/01/23 90 lb 12.8 oz (41.2 kg)      Physical Exam Vitals reviewed.  Constitutional:      General: She is not in acute distress.    Appearance: She is not ill-appearing.  HENT:      Head: Normocephalic and atraumatic.     Comments: No temporal artery tenderness. No scalp tenderness at this time. Cardiovascular:     Rate and Rhythm: Normal rate and regular rhythm.  Pulmonary:     Effort: Pulmonary effort is normal.     Breath sounds: Normal breath sounds.  Musculoskeletal:     Cervical back: Neck supple.  Neurological:     Mental Status: She is alert and oriented to person, place, and time.     Cranial Nerves: No cranial nerve deficit or facial asymmetry.     Motor: No weakness.     Coordination: Coordination normal.     Gait: Gait normal.  Psychiatric:        Mood and Affect: Mood normal.        Behavior: Behavior normal.      No results found for any visits on 01/06/24.    The ASCVD Risk score (Arnett DK, et al., 2019) failed to calculate for the following reasons:   The valid total cholesterol range is 130 to 320 mg/dL    Assessment & Plan:   Patient seen with 37-month history of left parieto-occipital head pressure .  She has noticed some occasional intermittent scalp tenderness.  Differential is occipital neuralgia, cervicogenic headache, versus other.  She does not have any red flags such as appetite change, weight loss, fever, history of head trauma.  Nonfocal neuroexam.  Given her age of 63 and persistence of pain over the past couple months recommend start with head CT without contrast.  If CT unremarkable and symptoms persist consider neurology referral  Wolm Scarlet, MD     [1]  Allergies Allergen Reactions   Codeine Nausea Only   Morphine And Codeine Nausea Only   "

## 2024-01-14 ENCOUNTER — Ambulatory Visit
Admission: RE | Admit: 2024-01-14 | Discharge: 2024-01-14 | Disposition: A | Discharge status: Home / Self Care | Source: Ambulatory Visit | Attending: Family Medicine | Admitting: Family Medicine

## 2024-01-14 DIAGNOSIS — R519 Headache, unspecified: Secondary | ICD-10-CM

## 2024-01-19 ENCOUNTER — Other Ambulatory Visit: Payer: Self-pay | Admitting: Adult Health

## 2024-01-21 ENCOUNTER — Telehealth: Payer: Self-pay | Admitting: *Deleted

## 2024-01-21 NOTE — Telephone Encounter (Signed)
 Pt advised that imaging is not resulted yet. Informed someone will give her a call when they are resulted.

## 2024-01-21 NOTE — Telephone Encounter (Signed)
 Copied from CRM #[id number]. Topic: Clinical - Lab/Test Results >> Jan 21, 2024  9:00 AM Macario HERO wrote: Reason for CRM: Patient called regarding CT scan results. Requesting a follow up

## 2024-01-24 ENCOUNTER — Ambulatory Visit: Payer: Self-pay | Admitting: Family Medicine
